# Patient Record
Sex: Male | Born: 1965 | Race: White | Hispanic: No | Marital: Single | Smoking: Current some day smoker
Health system: Southern US, Community
[De-identification: ages and names within clinical notes are randomized; demographics above are authoritative.]

## PROBLEM LIST (undated history)

## (undated) DIAGNOSIS — R188 Other ascites: Secondary | ICD-10-CM

## (undated) DIAGNOSIS — D509 Iron deficiency anemia, unspecified: Secondary | ICD-10-CM

## (undated) DIAGNOSIS — K449 Diaphragmatic hernia without obstruction or gangrene: Secondary | ICD-10-CM

## (undated) DIAGNOSIS — R161 Splenomegaly, not elsewhere classified: Secondary | ICD-10-CM

## (undated) DIAGNOSIS — K4090 Unilateral inguinal hernia, without obstruction or gangrene, not specified as recurrent: Secondary | ICD-10-CM

## (undated) DIAGNOSIS — M1990 Unspecified osteoarthritis, unspecified site: Secondary | ICD-10-CM

## (undated) DIAGNOSIS — C801 Malignant (primary) neoplasm, unspecified: Secondary | ICD-10-CM

## (undated) DIAGNOSIS — K7460 Unspecified cirrhosis of liver: Secondary | ICD-10-CM

## (undated) DIAGNOSIS — D696 Thrombocytopenia, unspecified: Secondary | ICD-10-CM

## (undated) DIAGNOSIS — E119 Type 2 diabetes mellitus without complications: Secondary | ICD-10-CM

## (undated) DIAGNOSIS — C649 Malignant neoplasm of unspecified kidney, except renal pelvis: Secondary | ICD-10-CM

## (undated) DIAGNOSIS — K766 Portal hypertension: Secondary | ICD-10-CM

## (undated) HISTORY — PX: FRACTURE SURGERY: SHX138

## (undated) HISTORY — DX: Diaphragmatic hernia without obstruction or gangrene: K44.9

---

## 2001-01-23 ENCOUNTER — Inpatient Hospital Stay (HOSPITAL_COMMUNITY): Admission: EM | Admit: 2001-01-23 | Discharge: 2001-01-28 | Payer: Self-pay | Admitting: Emergency Medicine

## 2001-01-23 ENCOUNTER — Encounter: Payer: Self-pay | Admitting: Emergency Medicine

## 2001-01-24 ENCOUNTER — Encounter: Payer: Self-pay | Admitting: Pulmonary Disease

## 2001-01-26 ENCOUNTER — Encounter: Payer: Self-pay | Admitting: Pulmonary Disease

## 2007-11-11 ENCOUNTER — Inpatient Hospital Stay (HOSPITAL_COMMUNITY): Admission: EM | Admit: 2007-11-11 | Discharge: 2007-11-16 | Payer: Self-pay | Admitting: Emergency Medicine
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
## 2010-10-03 LAB — CBC
HCT: 45.2
MCV: 91.5
Platelets: 310
RDW: 12.8
WBC: 16.8 — ABNORMAL HIGH

## 2010-10-03 LAB — PROTIME-INR: INR: 1

## 2010-10-03 LAB — TYPE AND SCREEN

## 2010-10-03 LAB — POCT I-STAT, CHEM 8
BUN: 13
Calcium, Ion: 1.15
Chloride: 108
Creatinine, Ser: 1.3
Glucose, Bld: 133 — ABNORMAL HIGH
HCT: 46
Potassium: 3.4 — ABNORMAL LOW

## 2010-10-03 LAB — DIFFERENTIAL
Monocytes Absolute: 1
Monocytes Relative: 6

## 2010-10-03 LAB — RAPID URINE DRUG SCREEN, HOSP PERFORMED
Amphetamines: NOT DETECTED
Barbiturates: NOT DETECTED
Benzodiazepines: NOT DETECTED

## 2010-10-03 LAB — ABO/RH: ABO/RH(D): A POS

## 2020-03-25 DIAGNOSIS — M059 Rheumatoid arthritis with rheumatoid factor, unspecified: Secondary | ICD-10-CM | POA: Insufficient documentation

## 2020-07-07 DIAGNOSIS — D509 Iron deficiency anemia, unspecified: Secondary | ICD-10-CM | POA: Insufficient documentation

## 2020-07-07 DIAGNOSIS — D696 Thrombocytopenia, unspecified: Secondary | ICD-10-CM | POA: Insufficient documentation

## 2020-07-07 DIAGNOSIS — K429 Umbilical hernia without obstruction or gangrene: Secondary | ICD-10-CM | POA: Insufficient documentation

## 2021-01-18 ENCOUNTER — Emergency Department: Payer: Commercial Managed Care - HMO

## 2021-01-18 ENCOUNTER — Encounter: Payer: Self-pay | Admitting: Medical Oncology

## 2021-01-18 ENCOUNTER — Emergency Department
Admission: EM | Admit: 2021-01-18 | Discharge: 2021-01-18 | Disposition: A | Discharge status: Home / Self Care | Payer: Commercial Managed Care - HMO | Attending: Emergency Medicine | Admitting: Emergency Medicine

## 2021-01-18 DIAGNOSIS — R718 Other abnormality of red blood cells: Secondary | ICD-10-CM | POA: Insufficient documentation

## 2021-01-18 DIAGNOSIS — K4090 Unilateral inguinal hernia, without obstruction or gangrene, not specified as recurrent: Secondary | ICD-10-CM | POA: Insufficient documentation

## 2021-01-18 DIAGNOSIS — K7460 Unspecified cirrhosis of liver: Secondary | ICD-10-CM

## 2021-01-18 DIAGNOSIS — K4091 Unilateral inguinal hernia, without obstruction or gangrene, recurrent: Secondary | ICD-10-CM

## 2021-01-18 DIAGNOSIS — N50811 Right testicular pain: Secondary | ICD-10-CM | POA: Diagnosis present

## 2021-01-18 DIAGNOSIS — I10 Essential (primary) hypertension: Secondary | ICD-10-CM | POA: Diagnosis not present

## 2021-01-18 DIAGNOSIS — K7469 Other cirrhosis of liver: Secondary | ICD-10-CM | POA: Insufficient documentation

## 2021-01-18 DIAGNOSIS — K8020 Calculus of gallbladder without cholecystitis without obstruction: Secondary | ICD-10-CM

## 2021-01-18 DIAGNOSIS — Z20822 Contact with and (suspected) exposure to covid-19: Secondary | ICD-10-CM | POA: Diagnosis not present

## 2021-01-18 HISTORY — DX: Unspecified osteoarthritis, unspecified site: M19.90

## 2021-01-18 HISTORY — DX: Type 2 diabetes mellitus without complications: E11.9

## 2021-01-18 LAB — RESP PANEL BY RT-PCR (FLU A&B, COVID) ARPGX2
Influenza A by PCR: NEGATIVE
Influenza B by PCR: NEGATIVE
SARS Coronavirus 2 by RT PCR: NEGATIVE

## 2021-01-18 LAB — CBC WITH DIFFERENTIAL/PLATELET
Abs Immature Granulocytes: 0.01 10*3/uL (ref 0.00–0.07)
Basophils Absolute: 0.1 10*3/uL (ref 0.0–0.1)
Basophils Relative: 1 %
Eosinophils Absolute: 0.2 10*3/uL (ref 0.0–0.5)
Eosinophils Relative: 5 %
HCT: 31.5 % — ABNORMAL LOW (ref 39.0–52.0)
Hemoglobin: 9 g/dL — ABNORMAL LOW (ref 13.0–17.0)
Immature Granulocytes: 0 %
Lymphocytes Relative: 20 %
Lymphs Abs: 1 10*3/uL (ref 0.7–4.0)
MCH: 20.1 pg — ABNORMAL LOW (ref 26.0–34.0)
MCHC: 28.6 g/dL — ABNORMAL LOW (ref 30.0–36.0)
MCV: 70.3 fL — ABNORMAL LOW (ref 80.0–100.0)
Monocytes Absolute: 0.6 10*3/uL (ref 0.1–1.0)
Monocytes Relative: 13 %
Neutro Abs: 2.9 10*3/uL (ref 1.7–7.7)
Neutrophils Relative %: 61 %
Platelets: 108 10*3/uL — ABNORMAL LOW (ref 150–400)
RBC: 4.48 MIL/uL (ref 4.22–5.81)
RDW: 16.8 % — ABNORMAL HIGH (ref 11.5–15.5)
WBC: 4.8 10*3/uL (ref 4.0–10.5)
nRBC: 0 % (ref 0.0–0.2)

## 2021-01-18 LAB — COMPREHENSIVE METABOLIC PANEL
ALT: 13 U/L (ref 0–44)
AST: 18 U/L (ref 15–41)
Albumin: 2.8 g/dL — ABNORMAL LOW (ref 3.5–5.0)
Alkaline Phosphatase: 92 U/L (ref 38–126)
Anion gap: 4 — ABNORMAL LOW (ref 5–15)
BUN: 14 mg/dL (ref 6–20)
CO2: 24 mmol/L (ref 22–32)
Calcium: 8 mg/dL — ABNORMAL LOW (ref 8.9–10.3)
Chloride: 104 mmol/L (ref 98–111)
Creatinine, Ser: 0.68 mg/dL (ref 0.61–1.24)
GFR, Estimated: >60 mL/min (ref >60)
Glucose, Bld: 370 mg/dL — ABNORMAL HIGH (ref 70–99)
Potassium: 3.9 mmol/L (ref 3.5–5.1)
Sodium: 132 mmol/L — ABNORMAL LOW (ref 135–145)
Total Bilirubin: 1 mg/dL (ref 0.3–1.2)
Total Protein: 7.3 g/dL (ref 6.5–8.1)

## 2021-01-18 LAB — LACTIC ACID, PLASMA: Lactic Acid, Venous: 1.9 mmol/L (ref 0.5–1.9)

## 2021-01-18 MED ORDER — OXYCODONE HCL 5 MG PO TABS: 5.0000 mg | ORAL_TABLET | Freq: Three times a day (TID) | ORAL | 0 refills | Status: DC | PRN

## 2021-01-18 NOTE — ED Triage Notes (Signed)
Pt reports for the past 2-3 days he has been having pain to rt groin from a hernia, pt states that the pain is radiating into rt side of his testicle. Pt denies NV. Pt denies dysuria.

## 2021-01-18 NOTE — ED Provider Notes (Signed)
[facility] Provider Note    Event Date/Time   First MD Initiated Contact with Patient 01/18/21 1151     (approximate)   History   Inguinal Hernia   HPI  Cole Pierce is a 56 y.o. male with a history of renal mass, chronic liver disease, right inguinal hernia presents to the emergency department for treatment and evaluation of right side scrotal/testicle pain.  He was diagnosed with an inguinal hernia approximately 2 months ago.  Pain has increased over the past 3 days.      Physical Exam   Triage Vital Signs: ED Triage Vitals [01/18/21 1137]  Enc Vitals Group     BP (!) 141/87     Pulse Rate 94     Resp 18     Temp 98.4 F (36.9 C)     Temp Source Oral     SpO2 100 %     Weight 182 lb (82.6 kg)     Height 5\' 8"  (1.727 m)     Head Circumference      Peak Flow      Pain Score 8     Pain Loc      Pain Edu?      Excl. in [place]?     Most recent vital signs: Vitals:   01/18/21 1317 01/18/21 1500  BP: 132/75 135/81  Pulse: 86 84  Resp: 16 16  Temp:    SpO2: 99% 100%    General: Awake, no distress.  CV:  Good peripheral perfusion.  Resp:  Normal effort.  Abd:  No distention.  Other:  Right side scrotum is soft, very tender to touch, normal in color   ED Results / Procedures / Treatments   Labs (all labs ordered are listed, but only abnormal results are displayed) Labs Reviewed  COMPREHENSIVE METABOLIC PANEL - Abnormal; Notable for the following components:      Result Value   Sodium 132 (*)    Glucose, Bld 370 (*)    Calcium 8.0 (*)    Albumin 2.8 (*)    Anion gap 4 (*)    All other components within normal limits  CBC WITH DIFFERENTIAL/PLATELET - Abnormal; Notable for the following components:   Hemoglobin 9.0 (*)    HCT 31.5 (*)    MCV 70.3 (*)    MCH 20.1 (*)    MCHC 28.6 (*)    RDW 16.8 (*)    Platelets 108 (*)    All other components within normal limits  RESP PANEL BY RT-PCR (FLU A&B, COVID) ARPGX2  LACTIC ACID,  PLASMA     EKG  Not indicated   RADIOLOGY  CT of the abdomen and pelvis shows hepatic cirrhosis, splenomegaly, cholelithiasis, complex mass in the lower pole of the right kidney, large volume ascites, and right inguinal hernia which also contains ascites.  PROCEDURES:  Critical Care performed: No  Procedures   MEDICATIONS ORDERED IN ED: Medications - No data to display   IMPRESSION / MDM / [place] / ED COURSE  I reviewed the triage vital signs and the nursing notes.                              Differential diagnosis includes, but is not limited to, incarcerated hernia, uncomplicated inguinal hernia.  56 year old male presents to the emergency department for treatment and evaluation of right-sided scrotal pain.  Symptoms have been present and ongoing for  the past couple of months, however pain has increased over the last 2 to 3 days.  He denies fevers, nausea, vomiting.  Plan will be to get imaging to rule out incarcerated hernia.  Upon review of the chart, he was evaluated in September at [facility] and diagnosed with chronic liver disease, renal mass, and a right inguinal hernia.  Patient tells me that he was referred to a specialist but has not made an appointment.   Review of labs from ER visit from [facility] show hypocalcemia at 8.4 hemoglobin 9.4 with 30.4 hematocrit, platelet count of 103.  Platelet morphology showing hypersegmented neutrophils, toxic granulocytes, and moderate poikilocytosis.  Today's lab values are similar with a calcium of 8.0, hemoglobin 9.0, hematocrit 31.5.  CT abdomen pelvis today shows hepatic cirrhosis, cholelithiasis, splenomegaly and small upper abdominal varices consistent with portal hypertension, cholelithiasis, complex mass in the lower pole of the right kidney, ascites, and a right inguinal hernia without incarceration.  These findings are similar to CT of the abdomen pelvis performed in September.  Patient states that he is not  having any abdominal pain with the exception of the pressure in the right suprapubic area, no nausea, or vomiting.   Results of labs and CT discussed with the patient and his wife.  Admission was offered for further work-up of abnormal CT findings and pain management of hernia..  Patient declined admission and requests to be referred to a specialist here in [place].  He was advised that it is extremely important that he make these follow-up appointments. He was advised against taking Tylenol or drinking any alcohol. For hernia pain control, he was advised to use an athletic support. He will be given a prescription for a few Roxicet as well. Referrals for GI and general surgery provided.  Very strict ER return precautions given.       FINAL CLINICAL IMPRESSION(S) / ED DIAGNOSES   Final diagnoses:  Chronic liver disease and cirrhosis ([place])  Calculus of gallbladder without cholecystitis without obstruction  Unilateral recurrent inguinal hernia without obstruction or gangrene     Rx / DC Orders   ED Discharge Orders          Ordered    oxyCODONE (ROXICODONE) 5 MG immediate release tablet  Every 8 hours PRN        01/18/21 1511             Note:  This document was prepared using Dragon voice recognition software and may include unintentional dictation errors.   Victorino Dike, FNP 01/18/21 1750    Naaman Plummer, MD 01/19/21 [phone]

## 2021-01-18 NOTE — Discharge Instructions (Addendum)
It is very important to call and schedule an appointment with both the GI specialist and the surgeon.  Do not take any medication that contains tylenol and do not drink alcohol.  Wear an athletic support to help with scrotal pain.  If the pain becomes worse, return to the ER.

## 2021-01-19 ENCOUNTER — Other Ambulatory Visit: Payer: Self-pay

## 2021-01-19 ENCOUNTER — Encounter: Payer: Self-pay | Admitting: Surgery

## 2021-01-19 ENCOUNTER — Ambulatory Visit (INDEPENDENT_AMBULATORY_CARE_PROVIDER_SITE_OTHER): Payer: Managed Care, Other (non HMO) | Admitting: Surgery

## 2021-01-19 VITALS — BP 128/79 | HR 93 | Temp 98.2°F | Ht 68.0 in | Wt 180.6 lb

## 2021-01-19 DIAGNOSIS — K7460 Unspecified cirrhosis of liver: Secondary | ICD-10-CM | POA: Diagnosis not present

## 2021-01-19 DIAGNOSIS — N179 Acute kidney failure, unspecified: Secondary | ICD-10-CM

## 2021-01-19 DIAGNOSIS — R188 Other ascites: Secondary | ICD-10-CM | POA: Insufficient documentation

## 2021-01-19 DIAGNOSIS — K4090 Unilateral inguinal hernia, without obstruction or gangrene, not specified as recurrent: Secondary | ICD-10-CM | POA: Diagnosis not present

## 2021-01-19 DIAGNOSIS — E119 Type 2 diabetes mellitus without complications: Secondary | ICD-10-CM

## 2021-01-19 DIAGNOSIS — N2889 Other specified disorders of kidney and ureter: Secondary | ICD-10-CM

## 2021-01-19 NOTE — Progress Notes (Signed)
Patient ID: Cole Pierce, male   DOB: November 21, 1965, 56 y.o.   MRN: [id number]  Chief Complaint: Right inguinal hernia pain  History of Present Illness Cole Pierce is a 56 y.o. male with known hepatic cirrhosis with ascites, previously diagnosed right inguinal hernia in September, also identified right lower pole renal lesion of 5.6 cm September CT scan at [facility].  Referred by ED from visit yesterday.  He reports his right groin pain resolves with recumbent positioning.  He has not utilize scrotal support as of yet.  He denies any history of alcohol abuse, IV drug abuse or prior history of hepatitis.  Was unaware he had liver disease despite CT scan last September.  He reports having been incarcerated at that time, and having very little to say in his medical referrals.  He has not pursued the consultations recommended secondary to not having the finances.  Concerned that any of the medications he has been getting for his arthritis might be to blame for his liver disease. He denies any history of right upper quadrant pain.  He denies any history of nausea, vomiting, fevers or chills.  Reports no urinary issues, and normal bowel movements.  Past Medical History Past Medical History:  Diagnosis Date   Arthritis    Diabetes mellitus without complication ([place])       Past Surgical History:  Procedure Laterality Date   FRACTURE SURGERY      Allergies  Allergen Reactions   Zantac [Ranitidine Hcl] Anaphylaxis   Penicillins Hives    Hives in mouth    Current Outpatient Medications  Medication Sig Dispense Refill   acetaminophen (TYLENOL) 325 MG tablet Take 2 tablets by mouth 3 (three) times daily as needed.     Methotrexate Sodium (METHOTREXATE, PF,) 50 MG/2ML injection as directed.     oxyCODONE (ROXICODONE) 5 MG immediate release tablet Take 1 tablet (5 mg total) by mouth every 8 (eight) hours as needed. 12 tablet 0   No current facility-administered medications for this visit.    Family  History History reviewed. No pertinent family history.    Social History Social History   Tobacco Use   Smoking status: Some Days    Types: Cigars   Smokeless tobacco: Never  Substance Use Topics   Alcohol use: Not Currently   Drug use: Never        Review of Systems  Constitutional: Negative.   HENT: Negative.    Eyes: Negative.   Respiratory: Negative.    Cardiovascular:  Negative for chest pain and orthopnea.  Gastrointestinal: Negative.   Genitourinary: Negative.   Musculoskeletal:  Positive for joint pain.  Skin: Negative.   Neurological: Negative.   Psychiatric/Behavioral: Negative.       Physical Exam Blood pressure 128/79, pulse 93, temperature 98.2 F (36.8 C), temperature source Oral, height 5\' 8"  (1.727 m), weight 180 lb 9.6 oz (81.9 kg), SpO2 98 %. Last Weight  Most recent update: 01/19/2021  9:08 AM    Weight  81.9 kg (180 lb 9.6 oz)             CONSTITUTIONAL: Well developed, and nourished, appropriately responsive and aware without distress.   EYES: Sclera non-icteric.   EARS, NOSE, MOUTH AND THROAT: Mask worn.    Hearing is intact to voice.  NECK: Trachea is midline, and there is no jugular venous distension.  LYMPH NODES:  Lymph nodes in the neck are not enlarged. RESPIRATORY:  Lungs are clear, and breath sounds are equal bilaterally. Normal respiratory  effort without pathologic use of accessory muscles. CARDIOVASCULAR: Heart is regular in rate and rhythm. GI: He has a flat bluish discolored umbilical skin, with associated underlying fascial defect that soft and nontender.  The abdomen is soft, nontender, and nondistended. There were no palpable masses. I did not appreciate hepatosplenomegaly. There were normal bowel sounds. GU: Readily appreciable right inguinal hernia, readily reducible.  She MUSCULOSKELETAL:  Symmetrical muscle tone appreciated in all four extremities.    SKIN: Skin turgor is normal. No pathologic skin lesions appreciated.   NEUROLOGIC:  Motor and sensation appear grossly normal.  Cranial nerves are grossly without defect. PSYCH:  Alert and oriented to person, place and time. Affect is appropriate for situation.  Data Reviewed I have personally reviewed what is currently available of the patient's imaging, recent labs and medical records.   Labs:  CBC Latest Ref Rng & Units 01/18/2021 11/11/2007 11/11/2007  WBC 4.0 - 10.5 K/uL 4.8 - 16.8(H)  Hemoglobin 13.0 - 17.0 g/dL 9.0(L) 15.6 15.1  Hematocrit 39.0 - 52.0 % 31.5(L) 46.0 45.2  Platelets 150 - 400 K/uL 108(L) - 310   CMP Latest Ref Rng & Units 01/18/2021 11/11/2007  Glucose 70 - 99 mg/dL 370(H) 133(H)  BUN 6 - 20 mg/dL 14 13  Creatinine 0.61 - 1.24 mg/dL 0.68 1.3  Sodium 135 - 145 mmol/L 132(L) 142  Potassium 3.5 - 5.1 mmol/L 3.9 3.4(L)  Chloride 98 - 111 mmol/L 104 108  CO2 22 - 32 mmol/L 24 -  Calcium 8.9 - 10.3 mg/dL 8.0(L) -  Total Protein 6.5 - 8.1 g/dL 7.3 -  Total Bilirubin 0.3 - 1.2 mg/dL 1.0 -  Alkaline Phos 38 - 126 U/L 92 -  AST 15 - 41 U/L 18 -  ALT 0 - 44 U/L 13 -      Imaging:  Within last 24 hrs: CT ABDOMEN WO CONTRAST  Result Date: 01/18/2021 CLINICAL DATA:  Right inguinal pain.  Abdominal pain.  Hernia. EXAM: CT ABDOMEN AND PELVIS WITHOUT CONTRAST TECHNIQUE: Multidetector CT imaging of the abdomen and pelvis was performed following the standard protocol without IV contrast. RADIATION DOSE REDUCTION: This exam was performed according to the departmental dose-optimization program which includes automated exposure control, adjustment of the mA and/or kV according to patient size and/or use of iterative reconstruction technique. COMPARISON:  None. FINDINGS: Lower chest: No acute abnormality. Hepatobiliary: Liver is diffusely nodular with inhomogeneous parenchyma consistent with cirrhosis. No definite focal mass identified in the liver. Gallbladder is nearly completely filled with calcified stones. There is mild gallbladder wall thickening  most likely chronic. No biliary ductal dilatation appreciated. Pancreas: Unremarkable. No pancreatic ductal dilatation or surrounding inflammatory changes. Spleen: Markedly enlarged measuring up to 17.4 cm in length. Adrenals/Urinary Tract: Adrenal glands appear normal. There is an approximately 5 x 3.5 cm bilobed appearing or adjacent lesions in the anterior lower pole the right kidney with the appearance of septations and partial calcifications, not well visualized without contrast. Otherwise no nephrolithiasis or hydronephrosis visualized bilaterally. Urinary bladder appears within normal limits. Stomach/Bowel: Mild wall thickening of the visualized distal esophagus. No bowel obstruction, free air or pneumatosis. Moderate amount of retained fecal material throughout the colon. No bowel wall edema identified. No evidence of acute appendicitis. Vascular/Lymphatic: Small varicosities throughout the upper abdomen. No bulky lymphadenopathy identified in the abdomen or pelvis. Reproductive: Prostate gland unremarkable. Other: Large volume ascites. Right inguinal hernia which contains ascitic fluid. Musculoskeletal: No acute or significant osseous findings. IMPRESSION: 1. Hepatic cirrhosis. 2. Marked splenomegaly  and small upper abdominal varices, consistent with portal hypertension. 3. Cholelithiasis. 4. Complex mass in the lower pole the right kidney as described, not well evaluated without contrast. Recommend follow-up nonemergent evaluation with ultrasound and/or renal mass protocol CT or MRI. 5. Large volume ascites. 6. Right inguinal hernia which contains ascites. 7. Mild wall thickening throughout the visualized distal esophagus, correlate clinically and follow-up as indicated. Electronically Signed   By: Ofilia Neas M.D.   On: 01/18/2021 14:27   CT PELVIS WO CONTRAST  Result Date: 01/18/2021 CLINICAL DATA:  Right inguinal pain.  Abdominal pain.  Hernia. EXAM: CT ABDOMEN AND PELVIS WITHOUT CONTRAST  TECHNIQUE: Multidetector CT imaging of the abdomen and pelvis was performed following the standard protocol without IV contrast. RADIATION DOSE REDUCTION: This exam was performed according to the departmental dose-optimization program which includes automated exposure control, adjustment of the mA and/or kV according to patient size and/or use of iterative reconstruction technique. COMPARISON:  None. FINDINGS: Lower chest: No acute abnormality. Hepatobiliary: Liver is diffusely nodular with inhomogeneous parenchyma consistent with cirrhosis. No definite focal mass identified in the liver. Gallbladder is nearly completely filled with calcified stones. There is mild gallbladder wall thickening most likely chronic. No biliary ductal dilatation appreciated. Pancreas: Unremarkable. No pancreatic ductal dilatation or surrounding inflammatory changes. Spleen: Markedly enlarged measuring up to 17.4 cm in length. Adrenals/Urinary Tract: Adrenal glands appear normal. There is an approximately 5 x 3.5 cm bilobed appearing or adjacent lesions in the anterior lower pole the right kidney with the appearance of septations and partial calcifications, not well visualized without contrast. Otherwise no nephrolithiasis or hydronephrosis visualized bilaterally. Urinary bladder appears within normal limits. Stomach/Bowel: Mild wall thickening of the visualized distal esophagus. No bowel obstruction, free air or pneumatosis. Moderate amount of retained fecal material throughout the colon. No bowel wall edema identified. No evidence of acute appendicitis. Vascular/Lymphatic: Small varicosities throughout the upper abdomen. No bulky lymphadenopathy identified in the abdomen or pelvis. Reproductive: Prostate gland unremarkable. Other: Large volume ascites. Right inguinal hernia which contains ascitic fluid. Musculoskeletal: No acute or significant osseous findings. IMPRESSION: 1. Hepatic cirrhosis. 2. Marked splenomegaly and small upper  abdominal varices, consistent with portal hypertension. 3. Cholelithiasis. 4. Complex mass in the lower pole the right kidney as described, not well evaluated without contrast. Recommend follow-up nonemergent evaluation with ultrasound and/or renal mass protocol CT or MRI. 5. Large volume ascites. 6. Right inguinal hernia which contains ascites. 7. Mild wall thickening throughout the visualized distal esophagus, correlate clinically and follow-up as indicated. Electronically Signed   By: Ofilia Neas M.D.   On: 01/18/2021 14:27    Assessment    Right inguinal hernia.  Patient Active Problem List   Diagnosis Date Noted   Type 2 diabetes mellitus without complication, without long-term current use of insulin ([place]) 01/19/2021   Cirrhosis of liver with ascites ([place]) 01/19/2021   Abdominal fluid collection 01/19/2021    Plan    This gentleman needs a renal CT scanning to evaluate this lesion further, to follow-up on the GI referral to evaluate the etiology of his cirrhosis, and to manage his ascites. He also needs to get plugged in with a PCP for management of his diabetes. At this point in time his right inguinal hernia repair is elective it is readily reducible, and exacerbated primarily by his ascitic fluid. I will be glad to see him back once these other priorities have been addressed.  Face-to-face time spent with the patient and accompanying care providers(if present)  was 30 minutes, with more than 50% of the time spent counseling, educating, and coordinating care of the patient.    These notes generated with voice recognition software. I apologize for typographical errors.  Ronny Bacon M.D., FACS 01/19/2021, 9:47 AM

## 2021-01-19 NOTE — Patient Instructions (Addendum)
Your CT is scheduled for Friday 02/03/2021 @ 1:30 pm (arrive by 1:15 pm) at the [facility] on [address]. Nothing to eat or drink 4 hours prior.   We have put in a referral to Urology. They will call you with an appointment.   If you have any concerns or questions, please feel free to call our office.   Cholelithiasis Cholelithiasis happens when gallstones form in the gallbladder. The gallbladder stores bile. Bile is a fluid that helps digest fats. Bile can harden and form into gallstones. If they cause a blockage, they can cause pain (gallbladder attack). What are the causes? This condition may be caused by: Some blood diseases, such as sickle cell anemia. Too much of a fat-like substance (cholesterol) in your bile. Not enough bile salts in your bile. These salts help the body absorb and digest fats. The gallbladder not emptying fully or often enough. This is common in pregnant women. What increases the risk? The following factors may make you more likely to develop this condition: Being male. Being pregnant many times. Eating a lot of fried foods, fat, and refined carbs (refined carbohydrates). Being very overweight (obese). Being older than age 44. Using medicines with male hormones in them for a long time. Losing weight fast. Having gallstones in your family. Having some health problems, such as diabetes, Crohn's disease, or liver disease. What are the signs or symptoms? Often, there may be gallstones but no symptoms. These gallstones are called silent gallstones. If a gallstone causes a blockage, you may get sudden pain. The pain: Can be in the upper right part of your belly (abdomen). Normally comes at night or after you eat. Can last an hour or more. Can spread to your right shoulder, back, or chest. Can feel like discomfort, burning, or fullness in the upper part of your belly (indigestion). If the blockage lasts more than a few hours, you can get an  infection or swelling. You may: Feel like you may vomit. Vomit. Feel bloated. Have belly pain for 5 hours or more. Feel tender in your belly, often in the upper right part and under your ribs. Have fever or chills. Have skin or the white parts of your eyes turn yellow (jaundice). Have dark pee (urine) or pale poop (stool). How is this treated? Treatment for this condition depends on how bad you feel. If you have symptoms, you may need: Home care, if symptoms are not very bad. Do not eat for 12-24 hours. Drink only water and clear liquids. Start to eat simple or clear foods after 1 or 2 days. Try broths and crackers. You may need medicines for pain or stomach upset or both. If you have an infection, you will need antibiotics. A hospital stay, if you have very bad pain or a very bad infection. Surgery to remove your gallbladder. You may need this if: Gallstones keep coming back. You have very bad symptoms. Medicines to break up gallstones. Medicines: Are best for small gallstones. May be used for up to 6-12 months. A procedure to find and take out gallstones or to break up gallstones. Follow these instructions at home: Medicines Take over-the-counter and prescription medicines only as told by your doctor. If you were prescribed an antibiotic medicine, take it as told by your doctor. Do not stop taking the antibiotic even if you start to feel better. Ask your doctor if the medicine prescribed to you requires you to avoid driving or using machinery. Eating and drinking Drink enough fluid  to keep your urine pale yellow. Drink water or clear fluids. This is important when you have pain. Eat healthy foods. Choose: Fewer fatty foods, such as fried foods. Fewer refined carbs. Avoid breads and grains that are highly processed, such as white bread and white rice. Choose whole grains, such as whole-wheat bread and brown rice. More fiber. Almonds, fresh fruit, and beans are healthy  sources. General instructions Keep a healthy weight. Keep all follow-up visits as told by your doctor. This is important. Where to find more information [employer] of Diabetes and Digestive and Kidney Diseases: [email or web address] Contact a doctor if: You have sudden pain in the upper right part of your belly. Pain might spread to your right shoulder, back, or chest. You have been diagnosed with gallstones that have no symptoms and you get: Belly pain. Discomfort, burning, or fullness in the upper part of your abdomen. You have dark urine or pale stools. Get help right away if: You have sudden pain in the upper right part of your abdomen, and the pain lasts more than 2 hours. You have pain in your abdomen, and: It lasts more than 5 hours. It keeps getting worse. You have a fever or chills. You keep feeling like you may vomit. You keep vomiting. Your skin or the white parts of your eyes turn yellow. Summary Cholelithiasis happens when gallstones form in the gallbladder. This condition may be caused by a blood disease, too much of a fat-like substance in the bile, or not enough bile salts in bile. Treatment for this condition depends on how bad you feel. If you have symptoms, do not eat or drink. You may need medicines. You may need a hospital stay for very bad pain or a very bad infection. You may need surgery if gallstones keep coming back or if you have very bad symptoms. This information is not intended to replace advice given to you by your health care provider. Make sure you discuss any questions you have with your health care provider.  Umbilical Hernia, Adult A hernia is a bulge of tissue that pushes through an opening between muscles. An umbilical hernia happens in the abdomen, near the belly button (umbilicus). The hernia may contain tissues from the small intestine, large intestine, or fatty tissue covering the intestines. Umbilical hernias in adults tend to get worse  over time, and they require surgical treatment. There are different types of umbilical hernias, including: Indirect hernia. This type is located just above or below the umbilicus. It is the most common type of umbilical hernia in adults. Direct hernia. This type forms through an opening formed by the umbilicus. Reducible hernia. This type of hernia comes and goes. It may be visible only when you strain, lift something heavy, or cough. This type of hernia can be pushed back into the abdomen (reduced). Incarcerated hernia. This type traps abdominal tissue inside the hernia. This type of hernia cannot be reduced. Strangulated hernia. This type of hernia cuts off blood flow to the tissues inside the hernia. The tissues can start to die if this happens. This type of hernia requires emergency treatment. What are the causes? An umbilical hernia happens when tissue inside the abdomen presses on a weak area of the abdominal muscles. What increases the risk? You may have a greater risk of this condition if you: Are obese. Have had several pregnancies. Have a buildup of fluid inside your abdomen. Have had surgery that weakens the abdominal muscles. What are the signs or symptoms?  The main symptom of this condition is a painless bulge at or near the belly button. A reducible hernia may be visible only when you strain, lift something heavy, or cough. Other symptoms may include: Dull pain. A feeling of pressure. Symptoms of a strangulated hernia may include: Pain that gets increasingly worse. Nausea and vomiting. Pain when pressing on the hernia. Skin over the hernia becoming red or purple. Constipation. Blood in the stool. How is this diagnosed? This condition may be diagnosed based on: A physical exam. You may be asked to cough or strain while standing. These actions increase the pressure inside your abdomen and can force the hernia through the opening in your muscles. Your health care provider may  try to reduce the hernia by pressing on it. Your symptoms and medical history. How is this treated? Surgery is the only treatment for an umbilical hernia. Surgery for a strangulated hernia is done as soon as possible. If you have a small hernia that is not incarcerated, you may need to lose weight before having surgery. Follow these instructions at home: Lose weight, if told by your health care provider. Do not try to push the hernia back in. Watch your hernia for any changes in color or size. Tell your health care provider if any changes occur. You may need to avoid activities that increase pressure on your hernia. Do not lift anything that is heavier than 10 lb (4.5 kg), or the limit that you are told, until your health care provider says that it is safe. Take over-the-counter and prescription medicines only as told by your health care provider. Keep all follow-up visits. This is important. Contact a health care provider if: Your hernia gets larger. Your hernia becomes painful. Get help right away if: You develop sudden, severe pain near the area of your hernia. You have pain as well as nausea or vomiting. You have pain and the skin over your hernia changes color. You develop a fever or chills. Summary A hernia is a bulge of tissue that pushes through an opening between muscles. An umbilical hernia happens near the belly button. Surgery is the only treatment for an umbilical hernia. Do not try to push your hernia back in. Keep all follow-up visits. This is important. This information is not intended to replace advice given to you by your health care provider. Make sure you discuss any questions you have with your health care provider.  Inguinal Hernia, Adult An inguinal hernia is when fat or your intestines push through a weak spot in a muscle where your leg meets your lower belly (groin). This causes a bulge. This kind of hernia could also be: In your scrotum, if you are male. In  folds of skin around your vagina, if you are male. There are three types of inguinal hernias: Hernias that can be pushed back into the belly (are reducible). This type rarely causes pain. Hernias that cannot be pushed back into the belly (are incarcerated). Hernias that cannot be pushed back into the belly and lose their blood supply (are strangulated). This type needs emergency surgery. What are the causes? This condition is caused by having a weak spot in the muscles or tissues in your groin. This develops over time. The hernia may poke through the weak spot when you strain your lower belly muscles all of a sudden, such as when you: Lift a heavy object. Strain to poop (have a bowel movement). Trouble pooping (constipation) can lead to straining. Cough. What increases  the risk? This condition is more likely to develop in: Males. Pregnant females. People who: Are overweight. Work in jobs that require long periods of standing or heavy lifting. Have had an inguinal hernia before. Smoke or have lung disease. These factors can lead to long-term (chronic) coughing. What are the signs or symptoms? Symptoms may depend on the size of the hernia. Often, a small hernia has no symptoms. Symptoms of a larger hernia may include: A bulge in the groin area. This is easier to see when standing. You might not be able to see it when you are lying down. Pain or burning in the groin. This may get worse when you lift, strain, or cough. A dull ache or a feeling of pressure in the groin. An abnormal bulge in the scrotum, in males. Symptoms of a strangulated inguinal hernia may include: A bulge in your groin that is very painful and tender to the touch. A bulge that turns red or purple. Fever, feeling like you may vomit (nausea), and vomiting. Not being able to poop or to pass gas. How is this treated? Treatment depends on the size of your hernia and whether you have symptoms. If you do not have symptoms,  your doctor may have you watch your hernia carefully and have you come in for follow-up visits. If your hernia is large or if you have symptoms, you may need surgery to repair the hernia. Follow these instructions at home: Lifestyle Avoid lifting heavy objects. Avoid standing for long amounts of time. Do not smoke or use any products that contain nicotine or tobacco. If you need help quitting, ask your doctor. Stay at a healthy weight. Prevent trouble pooping You may need to take these actions to prevent or treat trouble pooping: Drink enough fluid to keep your pee (urine) pale yellow. Take over-the-counter or prescription medicines. Eat foods that are high in fiber. These include beans, whole grains, and fresh fruits and vegetables. Limit foods that are high in fat and sugar. These include fried or sweet foods. General instructions You may try to push your hernia back in place by very gently pressing on it when you are lying down. Do not try to push the bulge back in if it will not go in easily. Watch your hernia for any changes in shape, size, or color. Tell your doctor if you see any changes. Take over-the-counter and prescription medicines only as told by your doctor. Keep all follow-up visits. Contact a doctor if: You have a fever or chills. You have new symptoms. Your symptoms get worse. Get help right away if: You have pain in your groin that gets worse all of a sudden. You have a bulge in your groin that: Gets bigger all of a sudden, and it does not get smaller after that. Turns red or purple. Is painful when you touch it. You are a male, and you have: Sudden pain in your scrotum. A sudden change in the size of your scrotum. You cannot push the hernia back in place by very gently pressing on it when you are lying down. You feel like you may vomit, and that feeling does not go away. You keep vomiting. You have a fast heartbeat. You cannot poop or pass gas. These symptoms  may be an emergency. Get help right away. Call your local emergency services (911 in the [place] Do not wait to see if the symptoms will go away. Do not drive yourself to the hospital. Summary An inguinal hernia is  when fat or your intestines push through a weak spot in a muscle where your leg meets your lower belly (groin). This causes a bulge. If you do not have symptoms, you may not need treatment. If you have symptoms or a large hernia, you may need surgery. Avoid lifting heavy objects. Also, avoid standing for long amounts of time. Do not try to push the bulge back in if it will not go in easily. This information is not intended to replace advice given to you by your health care provider. Make sure you discuss any questions you have with your health care provider. Document Revised: 08/18/2019 Document Reviewed: 08/18/2019 Elsevier Patient Education  2022 [employer].

## 2021-02-03 ENCOUNTER — Ambulatory Visit: Payer: Managed Care, Other (non HMO) | Attending: Surgery

## 2021-02-09 ENCOUNTER — Ambulatory Visit: Payer: Self-pay | Admitting: Urology

## 2021-02-21 ENCOUNTER — Ambulatory Visit
Admission: RE | Admit: 2021-02-21 | Discharge: 2021-02-21 | Disposition: A | Discharge status: Home / Self Care | Payer: Commercial Managed Care - HMO | Source: Ambulatory Visit | Attending: Surgery | Admitting: Surgery

## 2021-02-21 ENCOUNTER — Telehealth: Payer: Self-pay | Admitting: Surgery

## 2021-02-21 ENCOUNTER — Other Ambulatory Visit: Payer: Self-pay

## 2021-02-21 DIAGNOSIS — N179 Acute kidney failure, unspecified: Secondary | ICD-10-CM | POA: Insufficient documentation

## 2021-02-21 LAB — POCT I-STAT CREATININE: Creatinine, Ser: 0.6 mg/dL — ABNORMAL LOW (ref 0.61–1.24)

## 2021-02-21 MED ORDER — IOHEXOL 300 MG/ML  SOLN
100.0000 mL | Freq: Once | INTRAMUSCULAR | Status: AC | PRN
  Administered 2021-02-21: 100 mL via INTRAVENOUS

## 2021-02-21 NOTE — Telephone Encounter (Signed)
Spoke with patient-instructed to call [facility] and follow up and to keep his appointment with Dr.Sninsky on 02/23/21 and to call our office and schedule an appointment after he sees the above providers. Number provided.

## 2021-02-21 NOTE — Telephone Encounter (Signed)
Patient is calling and is asking if he needs to follow up with Dr. Christian Mate. Please advise.

## 2021-02-23 ENCOUNTER — Other Ambulatory Visit: Payer: Self-pay

## 2021-02-23 ENCOUNTER — Ambulatory Visit (INDEPENDENT_AMBULATORY_CARE_PROVIDER_SITE_OTHER): Payer: Managed Care, Other (non HMO) | Admitting: Urology

## 2021-02-23 ENCOUNTER — Encounter: Payer: Self-pay | Admitting: Urology

## 2021-02-23 VITALS — BP 129/66 | HR 91 | Ht 68.0 in | Wt 172.0 lb

## 2021-02-23 DIAGNOSIS — N289 Disorder of kidney and ureter, unspecified: Secondary | ICD-10-CM | POA: Diagnosis not present

## 2021-02-23 DIAGNOSIS — K7460 Unspecified cirrhosis of liver: Secondary | ICD-10-CM | POA: Diagnosis not present

## 2021-02-23 DIAGNOSIS — N2889 Other specified disorders of kidney and ureter: Secondary | ICD-10-CM

## 2021-02-23 DIAGNOSIS — N179 Acute kidney failure, unspecified: Secondary | ICD-10-CM

## 2021-02-23 DIAGNOSIS — R188 Other ascites: Secondary | ICD-10-CM

## 2021-02-23 LAB — URINALYSIS, COMPLETE
Bilirubin, UA: NEGATIVE
Leukocytes,UA: NEGATIVE
Nitrite, UA: NEGATIVE
Protein,UA: NEGATIVE
RBC, UA: NEGATIVE
Specific Gravity, UA: 1.025 (ref 1.005–1.030)
Urobilinogen, Ur: 1 mg/dL (ref 0.2–1.0)
pH, UA: 6 (ref 5.0–7.5)

## 2021-02-23 LAB — MICROSCOPIC EXAMINATION
Bacteria, UA: NONE SEEN
RBC: NONE SEEN /hpf (ref 0–2)

## 2021-02-23 MED ORDER — CELECOXIB 50 MG PO CAPS: 50.0000 mg | ORAL_CAPSULE | Freq: Every day | ORAL | 0 refills | Status: DC | PRN

## 2021-02-23 NOTE — Patient Instructions (Signed)
Renal Mass  A renal mass is an abnormal growth in the kidney. It may be found while performing an MRI, CT scan, or ultrasound to evaluate other problems of the abdomen. A renal mass that is cancerous (malignant) may grow or spread quickly. Others are not cancerous (benign). Renal masses include: Tumors. These may be malignant or benign. The most common type of kidney cancer in adults is renal cell carcinoma. In children, the most common type of kidney cancer is Wilms tumor. The most common benign tumors of the kidney include renal adenomas, oncocytomas, and angiomyolipoma (AML). Cysts. These are fluid-filled sacs that form on or in the kidney. What are the causes? Certain types of cancers, infections, or injuries can cause a renal mass. It isnot always known what causes a cyst to develop in or on the kidney. What are the signs or symptoms? Often, a renal mass does not cause any signs or symptoms; most kidney cysts donot cause symptoms. How is this diagnosed? Your health care provider may recommend tests to diagnose the cause of your renal mass. These tests may be done if a renal mass is found: Physical exam. Blood tests. Urine tests. Imaging tests, such as ultrasound, CT scan, or MRI. Biopsy. This is a small sample that is removed from the renal mass and tested in a lab. The exact tests and how often they are done will depend on: The size and appearance of the renal mass. Risk factors or medical conditions that increase your risk for problems. Any symptoms associated with the renal mass, or concerns that you have about it. Tests and physical exams may be done once, or they may be done regularly for a period of time. Tests and exams that are done regularly will help monitorwhether the mass is growing and beginning to cause problems. How is this treated? Treatment is not always needed for this condition. Your health care provider may recommend careful monitoring and regular tests and exams.  Treatment willdepend on the cause of the mass. Treatment for a cancerous renal mass may include surgical removal,chemotherapy, radiation, or immunotherapy. Most kidney cysts do not need to be treated. Follow these instructions at home: What you need to do at home will depend on the cause of the mass. Follow the instructions that your health care provider gives to you. In general: Take over-the-counter and prescription medicines only as told by your health care provider. If you were prescribed an antibiotic medicine, take it as told by your health care provider. Do not stop taking the antibiotic even if you start to feel better. Follow any restrictions that are given to you by your health care provider. Keep all follow-up visits. This is important. You may need to see your health care provider once or twice a year to have CT scans and ultrasounds. These tests will show if your renal mass has changed or grown. Contact a health care provider if you: Have pain in your side or back (flank pain). Have a fever. Feel full soon after eating. Have pain or swelling in the abdomen. Lose weight. Get help right away if: Your pain gets worse. There is blood in your urine. You cannot urinate. You have chest pain. You have trouble breathing. These symptoms may represent a serious problem that is an emergency. Do not wait to see if the symptoms will go away. Get medical help right away. Call your local emergency services (911 in the [place] Summary A renal mass is an abnormal growth in the kidney.   It may be cancerous (malignant) and grow or spread quickly, or it may not be cancerous (benign). Renal masses often do not have any signs or symptoms. Renal masses may be found while performing an MRI, CT scan, or ultrasound for other problems of the abdomen. Your health care provider may recommend that you have tests to diagnose the cause of your renal mass. These may include a physical exam, blood tests, urine  tests, imaging, or a biopsy. Treatment is not always needed for this condition. Careful monitoring may be recommended. This information is not intended to replace advice given to you by your health care provider. Make sure you discuss any questions you have with your healthcare provider. Document Revised: 06/15/2019 Document Reviewed: 06/15/2019 Elsevier Patient Education  2022 [employer] Inc[employer]  

## 2021-02-23 NOTE — Progress Notes (Signed)
02/23/21 3:18 PM   Cole Pierce 08/24/1965 [id number]  CC: Right renal mass, right scrotal pain  HPI: 56 year old male who previously has been incarcerated for the last 13 years and not received routine medical care and presents with the above issues.  He was originally seen in the ER on 01/18/2021 and CT abdomen and pelvis showed hepatic cirrhosis with significant splenomegaly, upper abdominal varices consistent with portal hypertension, complex mass in the lower pole of the right kidney measuring 5.5 cm that was similar to prior CT from September 2022 at [facility], as well as significant ascites and a right inguinal hernia which contained ascites.  He was seen by Dr. Christian Mate with general surgery who recommended GI referral to evaluate for the etiology of his cirrhosis and manage his ascites, as well as PCP referral to establish care for his diabetes, and referral to urology for the right renal mass.Right inguinal hernia repair was easily reducible and felt to be elective, and he recommended addressing the above issues prior to any surgical intervention.  Today, the patient does not remember anyone telling him he had a right renal mass from his scan at [facility] in September 2022.  He has never seen a urologist previously.  He did not drink while he was in prison, and reports rare alcohol over the last year, but prior to being incarcerated he reports he may drink as much as 5-6 shots per day.  He denies any gross hematuria.   PMH: Past Medical History:  Diagnosis Date   Arthritis    Diabetes mellitus without complication ([place])     Surgical History: Past Surgical History:  Procedure Laterality Date   FRACTURE SURGERY       Social History:  reports that he has been smoking cigars. He has never used smokeless tobacco. He reports that he does not currently use alcohol. He reports that he does not use drugs.  Physical Exam: BP 129/66 (BP Location: Left Arm, Patient Position: Sitting, Cuff Size:  Normal)    Pulse 91    Ht '5\' 8"'  (1.727 m)    Wt 172 lb (78 kg)    BMI 26.15 kg/m    Constitutional:  Alert and oriented, No acute distress. Cardiovascular: No clubbing, cyanosis, or edema. Respiratory: Normal respiratory effort, no increased work of breathing. 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  He was seen by general surgery here regarding his right inguinal hernia with ascites, and any intervention was deferred prior to addressing his other issues of hepatic cirrhosis and ascites of unclear etiology, right renal mass consistent with malignancy, and his uncontrolled diabetes.  I had a frank conversation with the patient about his hepatic cirrhosis and ascites complicating his care and making  him a high risk surgical candidate for any intervention.  We reviewed that renal mass the size would typically be managed with surgery, most commonly a partial nephrectomy, however he is better served at a tertiary care center with his comorbidities and cirrhosis.  We also need to refer him to GI for further evaluation of etiology of his cirrhosis, and PCP for management of his diabetes.  I also communicated these findings with Dr.  Christian Mate from general surgery.  Referral placed to GI for hepatic cirrhosis and ascites Referral placed to [facility] urology for consideration of partial versus radical right nephrectomy  Cole Madrid, MD 02/23/2021  [facility] [address] [place], [place] [address] [phone]

## 2021-02-27 ENCOUNTER — Telehealth: Payer: Self-pay

## 2021-02-27 NOTE — Telephone Encounter (Signed)
Called no answer and no voicemal

## 2021-04-03 ENCOUNTER — Other Ambulatory Visit: Payer: Self-pay

## 2021-04-03 ENCOUNTER — Ambulatory Visit (INDEPENDENT_AMBULATORY_CARE_PROVIDER_SITE_OTHER): Payer: 59 | Admitting: Nurse Practitioner

## 2021-04-03 ENCOUNTER — Encounter: Payer: Self-pay | Admitting: Nurse Practitioner

## 2021-04-03 ENCOUNTER — Other Ambulatory Visit: Payer: Self-pay | Admitting: Nurse Practitioner

## 2021-04-03 ENCOUNTER — Telehealth: Payer: Self-pay

## 2021-04-03 VITALS — BP 118/72 | HR 85 | Temp 98.5°F | Resp 18 | Ht 68.0 in | Wt 174.0 lb

## 2021-04-03 DIAGNOSIS — B86 Scabies: Secondary | ICD-10-CM

## 2021-04-03 DIAGNOSIS — R188 Other ascites: Secondary | ICD-10-CM

## 2021-04-03 DIAGNOSIS — D696 Thrombocytopenia, unspecified: Secondary | ICD-10-CM | POA: Diagnosis not present

## 2021-04-03 DIAGNOSIS — M059 Rheumatoid arthritis with rheumatoid factor, unspecified: Secondary | ICD-10-CM

## 2021-04-03 DIAGNOSIS — E1165 Type 2 diabetes mellitus with hyperglycemia: Secondary | ICD-10-CM | POA: Diagnosis not present

## 2021-04-03 DIAGNOSIS — D509 Iron deficiency anemia, unspecified: Secondary | ICD-10-CM

## 2021-04-03 DIAGNOSIS — K7460 Unspecified cirrhosis of liver: Secondary | ICD-10-CM

## 2021-04-03 MED ORDER — BLOOD GLUCOSE MONITORING SUPPL W/DEVICE KIT: 1.0000 | PACK | Freq: Every day | 0 refills | Status: AC

## 2021-04-03 MED ORDER — PERMETHRIN 5 % EX CREA: TOPICAL_CREAM | CUTANEOUS | 1 refills | Status: DC

## 2021-04-03 MED ORDER — GLIPIZIDE 5 MG PO TABS: 5.0000 mg | ORAL_TABLET | Freq: Two times a day (BID) | ORAL | 3 refills | Status: DC

## 2021-04-03 MED ORDER — BLOOD GLUCOSE TEST VI STRP: ORAL_STRIP | 3 refills | Status: AC

## 2021-04-03 NOTE — Telephone Encounter (Signed)
Copied from [place] [id number]. Topic: General - Other ?>> Apr 03, 2021 11:14 AM Valere Dross wrote: ?Reason for CRM: Pt called in stating he just left his appt today, and once the pt got to the pharmacy they told him that the strips were missing and to get the provider to send them over, please advise. ?

## 2021-04-03 NOTE — Progress Notes (Signed)
? ?BP 118/72   Pulse 85   Temp 98.5 ?F (36.9 ?C) (Oral)   Resp 18   Ht _0  (1.727 m)   Wt 174 lb (78.9 kg)   SpO2 98%   BMI 26.46 kg/m?   ? ?Subjective:  ? ? Patient ID: Cole Pierce, male    DOB: 1965-12-21, 56 y.o.   MRN: [id number] ? ?HPI: ?Cole Pierce is a 56 y.o. male ? ?Chief Complaint  ?Patient presents with  ? Establish Care  ? Rash  ?  Chest and legs  ? ?Establish care: He says that his last physical was a while ago. He says he saw GI, Dr. Croley([facility]), and urology Dr. Diamantina Providence. Referral placed by Dr. Chauncey Mann was for hematology for iv iron infusions and dermatology for skin rash.  ? ?DM:  He says he was put on metformin and was unable to tolerate it.  He says that it gave him really bad stomach pain and nausea.  Discussed different treatment options, will start with glipizide 5 mg daily.  He denies any polyuria, polydipsia or polyphagia.  Last A1C was 10.2 on 03/27/2021.  Will recheck in three months.  ? ?Rheumatoid Arthritis: He is currently taking methotrexate. He was seeing a rheumatologist in [place] but needs a new one. He has cirrhosis of the liver, recommended he stop taking methotrexate and see new rheumatologist. Referral placed.  ? ?Scabies: He says he has had this rash all over his body for a few weeks. He was seen at urgent care and treated for yeast. Rash continued.  He does have a rash between his toes and hands, up his arms and on his chest and back.  There does appear to be scabies tacks.  Will treat for scabies. He already has a referral to dermatology placed by Dr. Chauncey Mann.   ? ?Thrombocytopenia/ iron deficiency anemia: Referral for hematologist was placed by Dr. Chauncey Mann.  Patient says he tried to do oral ion supplements but was unable to tolerate. H/H 9.1/31.8, RBC 4.61, plt 102, ferritin 12. ? ?Cirrhosis of liver: He had a ct done on 01/18/2021 which showed hepatic cirrhosis, marked splenomegaly with small upper abdominal varices, cholethiasis. He saw GI Dr. Chauncey Mann  on 03/27/2021, he is going to have an EGD and colonoscopy. Patient says he used to drink before being in prison but stopped while he was in prison. He does report that he has started drinking again. Discussed that he needs to avoid alcohol. Stopped methotrexate because it is hepatic toxic. Placed referral to rheumatologist to discuss other treatment options.  ? ?Right renal mass:He saw Dr. Diamantina Providence on 02/23/2021 for right renal mass.  Ct showed Enhancing heterogeneous 5.4 x 4.3 x 4.7 cm right lower pole renal ?lesion extends and effaces both Gerota's fascia and the right psoas ?muscle however there appears to be preservation of fat planes ?between both structures likely reflecting abutment without invasion. He was was told that surgery is the treatment however he is a high risk surgical candidate. He was referred to a tertiary care center.  ? ?Relevant past medical, surgical, family and social history reviewed and updated as indicated. Interim medical history since our last visit reviewed. ?Allergies and medications reviewed and updated. ? ?Review of Systems ? ?Constitutional: Negative for fever or weight change.  ?Respiratory: Negative for cough and shortness of breath.   ?Cardiovascular: Negative for chest pain or palpitations.  ?Gastrointestinal: Negative for abdominal pain, no bowel changes. Positive for hernia ?Musculoskeletal: Negative for gait problem or  joint swelling.  ?Skin: positive for rash.  ?Neurological: Negative for dizziness or headache.  ?No other specific complaints in a complete review of systems (except as listed in HPI above).  ? ?   ?Objective:  ?  ?BP 118/72   Pulse 85   Temp 98.5 ?F (36.9 ?C) (Oral)   Resp 18   Ht _0  (1.727 m)   Wt 174 lb (78.9 kg)   SpO2 98%   BMI 26.46 kg/m?   ?Wt Readings from Last 3 Encounters:  ?04/03/21 174 lb (78.9 kg)  ?02/23/21 172 lb (78 kg)  ?01/19/21 180 lb 9.6 oz (81.9 kg)  ?  ?Physical Exam ? ?Constitutional: Patient appears well-developed and  well-nourished.  No distress.  ?HEENT: head atraumatic, normocephalic, pupils equal and reactive to light,  neck supple ?Cardiovascular: Normal rate, regular rhythm and normal heart sounds.  No murmur heard. No BLE edema. ?Pulmonary/Chest: Effort normal and breath sounds normal. No respiratory distress. ?Abdominal: Soft.  There is no tenderness, right inguinal hernia ?Skin: rash on chest, back, arms, legs, between fingers and toes ?Psychiatric: Patient has a normal mood and affect. behavior is normal. Judgment and thought content normal.  ? ?Results for orders placed or performed in visit on 02/23/21  ?Microscopic Examination  ? Urine  ?Result Value Ref Range  ? WBC, UA 0-5 0 - 5 /hpf  ? RBC None seen 0 - 2 /hpf  ? Epithelial Cells (non renal) 0-10 0 - 10 /hpf  ? Bacteria, UA None seen None seen/Few  ?Urinalysis, Complete  ?Result Value Ref Range  ? Specific Gravity, UA 1.025 1.005 - 1.030  ? pH, UA 6.0 5.0 - 7.5  ? Color, UA Yellow Yellow  ? Appearance Ur Clear Clear  ? Leukocytes,UA Negative Negative  ? Protein,UA Negative Negative/Trace  ? Glucose, UA 3+ (A) Negative  ? Ketones, UA Trace (A) Negative  ? RBC, UA Negative Negative  ? Bilirubin, UA Negative Negative  ? Urobilinogen, Ur 1.0 0.2 - 1.0 mg/dL  ? Nitrite, UA Negative Negative  ? Microscopic Examination See below:   ? ?   ?Assessment & Plan:  ? ?1. Scabies ? ?- permethrin (ELIMITE) 5 % cream; Apply to entire body other than face - let sit for 14 hours then wash off, may repeat in 1 week if still having symptoms  Dispense: 60 g; Refill: 1 ? ?2. Seropositive rheumatoid arthritis ([place]) ?-stop methotrexate ?- Ambulatory referral to Rheumatology ? ?3. Type 2 diabetes mellitus with hyperglycemia, without long-term current use of insulin ([place]) ? ?- Blood Glucose Monitoring Suppl w/Device KIT; 1 each by Does not apply route daily.  Dispense: 1 kit; Refill: 0 ?- glipiZIDE (GLUCOTROL) 5 MG tablet; Take 1 tablet (5 mg total) by mouth 2 (two) times daily before a  meal.  Dispense: 60 tablet; Refill: 3 ? ?4. Thrombocytopenia ([place]) ?-referral to hematology placed by GI ? ?5. Iron deficiency anemia, unspecified iron deficiency anemia type ?-referral to hematology placed by GI ? ?6. Cirrhosis of liver with ascites, unspecified hepatic cirrhosis type ([place]) ?-stop methotrexate ?-keep appointment with GI ? ?Follow up plan: ?Return in about 3 months (around 07/03/2021) for follow up. ? ? ? ? ? ?

## 2021-04-17 ENCOUNTER — Inpatient Hospital Stay: Payer: 59 | Attending: Oncology | Admitting: Oncology

## 2021-04-17 ENCOUNTER — Inpatient Hospital Stay: Payer: 59

## 2021-05-17 ENCOUNTER — Ambulatory Visit: Payer: Managed Care, Other (non HMO) | Admitting: Gastroenterology

## 2021-06-14 ENCOUNTER — Inpatient Hospital Stay: Payer: Commercial Managed Care - HMO

## 2021-06-14 ENCOUNTER — Inpatient Hospital Stay: Payer: Commercial Managed Care - HMO | Admitting: Oncology

## 2021-06-19 ENCOUNTER — Encounter
Admission: RE | Disposition: A | Discharge status: Home / Self Care | Payer: Self-pay | Source: Ambulatory Visit | Attending: Gastroenterology

## 2021-06-19 ENCOUNTER — Ambulatory Visit
Admission: RE | Admit: 2021-06-19 | Discharge: 2021-06-19 | Disposition: A | Discharge status: Home / Self Care | Payer: Commercial Managed Care - HMO | Source: Ambulatory Visit | Attending: Gastroenterology | Admitting: Gastroenterology

## 2021-06-19 ENCOUNTER — Encounter: Payer: Self-pay | Admitting: Anesthesiology

## 2021-06-19 DIAGNOSIS — Z01818 Encounter for other preprocedural examination: Secondary | ICD-10-CM | POA: Insufficient documentation

## 2021-06-19 DIAGNOSIS — Z538 Procedure and treatment not carried out for other reasons: Secondary | ICD-10-CM | POA: Diagnosis not present

## 2021-06-19 HISTORY — PX: COLONOSCOPY: SHX5424

## 2021-06-19 HISTORY — PX: ESOPHAGOGASTRODUODENOSCOPY: SHX5428

## 2021-06-19 HISTORY — DX: Unspecified cirrhosis of liver: K74.60

## 2021-06-19 HISTORY — DX: Iron deficiency anemia, unspecified: D50.9

## 2021-06-19 HISTORY — DX: Other ascites: R18.8

## 2021-06-19 HISTORY — DX: Splenomegaly, not elsewhere classified: R16.1

## 2021-06-19 HISTORY — DX: Unilateral inguinal hernia, without obstruction or gangrene, not specified as recurrent: K40.90

## 2021-06-19 HISTORY — DX: Malignant (primary) neoplasm, unspecified: C80.1

## 2021-06-19 HISTORY — DX: Malignant neoplasm of unspecified kidney, except renal pelvis: C64.9

## 2021-06-19 HISTORY — DX: Thrombocytopenia, unspecified: D69.6

## 2021-06-19 HISTORY — DX: Portal hypertension: K76.6

## 2021-06-19 SURGERY — COLONOSCOPY
Anesthesia: General

## 2021-06-19 MED ORDER — PROPOFOL 1000 MG/100ML IV EMUL
INTRAVENOUS | Status: AC
  Filled 2021-06-19: qty 100

## 2021-06-19 MED ORDER — LIDOCAINE HCL (PF) 2 % IJ SOLN
INTRAMUSCULAR | Status: AC
  Filled 2021-06-19: qty 5

## 2021-06-19 NOTE — Anesthesia Preprocedure Evaluation (Signed)
Anesthesia Evaluation  Patient identified by MRN, date of birth, ID band Patient awake    Reviewed: Allergy & Precautions, H&P , NPO status , Patient's Chart, lab work & pertinent test results, reviewed documented beta blocker date and time   Airway Mallampati: III   Neck ROM: full    Dental  (+) Poor Dentition   Pulmonary neg pulmonary ROS, Current Smoker,    Pulmonary exam normal        Cardiovascular Exercise Tolerance: Good negative cardio ROS Normal cardiovascular exam Rhythm:regular Rate:Normal     Neuro/Psych  Neuromuscular disease negative neurological ROS  negative psych ROS   GI/Hepatic negative GI ROS, Neg liver ROS, hiatal hernia,   Endo/Other  negative endocrine ROSdiabetes, Well Controlled, Type 2  Renal/GU Renal diseasenegative Renal ROS  negative genitourinary   Musculoskeletal   Abdominal   Peds  Hematology negative hematology ROS (+) Blood dyscrasia, anemia ,   Anesthesia Other Findings Past Medical History: No date: Arthritis No date: Ascites No date: Cancer (HCC) No date: Cirrhosis (HCC) No date: Diabetes mellitus without complication (HCC) No date: Hernia, hiatal No date: IDA (iron deficiency anemia) No date: Inguinal hernia     Comment:  right No date: Portal venous hypertension (HCC) No date: Renal cell carcinoma (HCC) No date: Splenomegaly No date: Thrombocytopenia ([place]) Past Surgical History: No date: FRACTURE SURGERY   Reproductive/Obstetrics negative OB ROS                             Anesthesia Physical Anesthesia Plan  ASA: 3  Anesthesia Plan: General   Post-op Pain Management:    Induction:   PONV Risk Score and Plan:   Airway Management Planned:   Additional Equipment:   Intra-op Plan:   Post-operative Plan:   Informed Consent: I have reviewed the patients History and Physical, chart, labs and discussed the procedure including the  risks, benefits and alternatives for the proposed anesthesia with the patient or authorized representative who has indicated his/her understanding and acceptance.     Dental Advisory Given  Plan Discussed with: CRNA  Anesthesia Plan Comments:         Anesthesia Quick Evaluation

## 2021-06-19 NOTE — OR Nursing (Signed)
Pt arrived to endo. Just finished prep at 1200pm then ate regular food all day yesterday with pizza at supper. Dr Haig Prophet spoke with patient and procedure cancelled

## 2021-06-20 ENCOUNTER — Encounter: Payer: Self-pay | Admitting: Gastroenterology

## 2021-06-20 ENCOUNTER — Telehealth (INDEPENDENT_AMBULATORY_CARE_PROVIDER_SITE_OTHER): Payer: 59 | Admitting: Nurse Practitioner

## 2021-06-20 DIAGNOSIS — R21 Rash and other nonspecific skin eruption: Secondary | ICD-10-CM | POA: Diagnosis not present

## 2021-06-20 DIAGNOSIS — B86 Scabies: Secondary | ICD-10-CM | POA: Diagnosis not present

## 2021-06-20 MED ORDER — PREDNISONE 10 MG (21) PO TBPK: ORAL_TABLET | ORAL | 0 refills | Status: DC

## 2021-06-20 MED ORDER — PERMETHRIN 5 % EX CREA: TOPICAL_CREAM | CUTANEOUS | 1 refills | Status: DC

## 2021-06-20 MED ORDER — HYDROXYZINE PAMOATE 25 MG PO CAPS: 25.0000 mg | ORAL_CAPSULE | Freq: Three times a day (TID) | ORAL | 0 refills | Status: DC | PRN

## 2021-06-20 NOTE — Progress Notes (Signed)
Name: Shaine Newmark   MRN: [id number]    DOB: 11/21/1965   Date:06/20/2021       Progress Note  Subjective  Chief Complaint  Chief Complaint  Patient presents with   Follow-up   Rash    Itches badly has got worst spreading to ears,face all over body. Some parts are red/white  Pt would like to discuss hep b shot    I connected with  Yaakov Juarbe  on 06/20/21 at  1:40 PM EDT by a telepphone enabled telemedicine application and verified that I am speaking with the correct person using two identifiers.  I discussed the limitations of evaluation and management by telemedicine and the availability of in person appointments. The patient expressed understanding and agreed to proceed with a virtual visit  Staff also discussed with the patient that there may be a patient responsible charge related to this service. Patient Location: home Provider Location: home Additional Individuals present: alone  HPI  Rash: Patient reports that he has had a rash all over for several months getting increasingly worse. Patient has previously been treated for scabies.  Patient reports that the rash never improved and has gotten worse.  He says it is all over now. Patient reports that he can pull something out of his skin and it is alive.  Difficult to evaluate in virtual appointment. Will send in another treatment of permethrin and vistaril and course of steroids.  Discussed if no improvement patient will need in person appointment.   Patient also needs to clean all bedding and clothing in hot water.   Patient Active Problem List   Diagnosis Date Noted   Type 2 diabetes mellitus without complication, without long-term current use of insulin ([place]) 01/19/2021   Cirrhosis of liver with ascites ([place]) 01/19/2021   Abdominal fluid collection 01/19/2021   Iron deficiency anemia 07/07/2020   Thrombocytopenia ([place]) 44/81/8563   Umbilical hernia 14/97/0263   Seropositive rheumatoid arthritis ([place]) 03/25/2020     Social History   Tobacco Use   Smoking status: Some Days    Types: Cigars   Smokeless tobacco: Never  Substance Use Topics   Alcohol use: Not Currently    Comment: had quit for 13 years and recently started back     Current Outpatient Medications:    Blood Glucose Monitoring Suppl w/Device KIT, 1 each by Does not apply route daily., Disp: 1 kit, Rfl: 0   glipiZIDE (GLUCOTROL) 5 MG tablet, Take 1 tablet (5 mg total) by mouth 2 (two) times daily before a meal., Disp: 60 tablet, Rfl: 3   Glucose Blood (BLOOD GLUCOSE TEST STRIPS) STRP, Use as directed to monitor FSBS once daily for DM, Disp: 100 strip, Rfl: 3   hydrOXYzine (VISTARIL) 25 MG capsule, Take 1 capsule (25 mg total) by mouth every 8 (eight) hours as needed for itching., Disp: 30 capsule, Rfl: 0   Methotrexate Sodium (METHOTREXATE, PF,) 50 MG/2ML injection, as directed., Disp: , Rfl:    predniSONE (STERAPRED UNI-PAK 21 TAB) 10 MG (21) TBPK tablet, Take as directed on package.  (60 mg po on day 1, 50 mg po on day 2...), Disp: 21 tablet, Rfl: 0   permethrin (ELIMITE) 5 % cream, Apply to entire body other than face - let sit for 14 hours then wash off, may repeat in 1 week if still having symptoms, Disp: 60 g, Rfl: 1  Allergies  Allergen Reactions   Zantac [Ranitidine Hcl] Anaphylaxis   Penicillins Hives    Hives in  mouth    I personally reviewed active problem list, medication list, allergies, notes from last encounter with the patient/caregiver today.  ROS  Constitutional: Negative for fever or weight change.  Respiratory: Negative for cough and shortness of breath.   Cardiovascular: Negative for chest pain or palpitations.  Gastrointestinal: Negative for abdominal pain, no bowel changes.  Musculoskeletal: Negative for gait problem or joint swelling.  Skin:positive for rash.  Neurological: Negative for dizziness or headache.  No other specific complaints in a complete review of systems (except as listed in HPI  above).   Objective  Virtual encounter, vitals not obtained.  There is no height or weight on file to calculate BMI.  Nursing Note and Vital Signs reviewed.  Physical Exam  Awake, alert and oriented, speaking in complete sentences.   No results found for this or any previous visit (from the past 72 hour(s)).  Assessment & Plan  1. Rash Comments: continued worsening rash, will treat with another course of permethrin, prednisone and hydroxyzine.  - hydrOXYzine (VISTARIL) 25 MG capsule; Take 1 capsule (25 mg total) by mouth every 8 (eight) hours as needed for itching.  Dispense: 30 capsule; Refill: 0 - predniSONE (STERAPRED UNI-PAK 21 TAB) 10 MG (21) TBPK tablet; Take as directed on package.  (60 mg po on day 1, 50 mg po on day 2...)  Dispense: 21 tablet; Refill: 0  2. Scabies Comments: continued worsening rash, will treat with another course of permethrin, prednisone and hydroxyzine. wash all linens in hot water - permethrin (ELIMITE) 5 % cream; Apply to entire body other than face - let sit for 14 hours then wash off, may repeat in 1 week if still having symptoms  Dispense: 60 g; Refill: 1    -Red flags and when to present for emergency care or RTC including fever >101.37F, chest pain, shortness of breath, new/worsening/un-resolving symptoms,  reviewed with patient at time of visit. Follow up and care instructions discussed and provided in AVS. - I discussed the assessment and treatment plan with the patient. The patient was provided an opportunity to ask questions and all were answered. The patient agreed with the plan and demonstrated an understanding of the instructions.  I provided 20 minutes of non-face-to-face time during this encounter.  Bo Merino, FNP

## 2021-06-26 ENCOUNTER — Encounter: Payer: Self-pay | Admitting: Gastroenterology

## 2021-06-28 ENCOUNTER — Ambulatory Visit: Payer: 59 | Admitting: Nurse Practitioner

## 2021-06-28 NOTE — Progress Notes (Deleted)
   There were no vitals taken for this visit.   Subjective:    Patient ID: Cole Pierce, male    DOB: 1965/05/02, 56 y.o.   MRN: [id number]  HPI: Cole Pierce is a 56 y.o. male  No chief complaint on file.  Hep B immunization:  Patient is here today to get his hep B immunization.  He recently had labs done that showed he did not have he was non reactive to Hepatitis B antigen, antibody or Qual.  Will start Hep B series.    Scabies/rash:   Diabetes:  Cirrhosis of the liver:  Rheumatoid arthritis:  Thrombocytopenia/anemia:   Relevant past medical, surgical, family and social history reviewed and updated as indicated. Interim medical history since our last visit reviewed. Allergies and medications reviewed and updated.  Review of Systems  Per HPI unless specifically indicated above     Objective:    There were no vitals taken for this visit.  Wt Readings from Last 3 Encounters:  04/03/21 174 lb (78.9 kg)  02/23/21 172 lb (78 kg)  01/19/21 180 lb 9.6 oz (81.9 kg)    Physical Exam  Results for orders placed or performed in visit on 02/23/21  Microscopic Examination   Urine  Result Value Ref Range   WBC, UA 0-5 0 - 5 /hpf   RBC None seen 0 - 2 /hpf   Epithelial Cells (non renal) 0-10 0 - 10 /hpf   Bacteria, UA None seen None seen/Few  Urinalysis, Complete  Result Value Ref Range   Specific Gravity, UA 1.025 1.005 - 1.030   pH, UA 6.0 5.0 - 7.5   Color, UA Yellow Yellow   Appearance Ur Clear Clear   Leukocytes,UA Negative Negative   Protein,UA Negative Negative/Trace   Glucose, UA 3+ (A) Negative   Ketones, UA Trace (A) Negative   RBC, UA Negative Negative   Bilirubin, UA Negative Negative   Urobilinogen, Ur 1.0 0.2 - 1.0 mg/dL   Nitrite, UA Negative Negative   Microscopic Examination See below:       Assessment & Plan:   Problem List Items Addressed This Visit   None    Follow up plan: No follow-ups on file.

## 2021-07-03 ENCOUNTER — Encounter: Payer: Self-pay | Admitting: Nurse Practitioner

## 2021-07-03 ENCOUNTER — Ambulatory Visit (INDEPENDENT_AMBULATORY_CARE_PROVIDER_SITE_OTHER): Payer: Commercial Managed Care - HMO | Admitting: Nurse Practitioner

## 2021-07-03 VITALS — BP 118/72 | HR 83 | Temp 98.0°F | Resp 18 | Ht 68.0 in | Wt 166.7 lb

## 2021-07-03 DIAGNOSIS — Z23 Encounter for immunization: Secondary | ICD-10-CM | POA: Diagnosis not present

## 2021-07-03 DIAGNOSIS — K4091 Unilateral inguinal hernia, without obstruction or gangrene, recurrent: Secondary | ICD-10-CM

## 2021-07-03 DIAGNOSIS — E1165 Type 2 diabetes mellitus with hyperglycemia: Secondary | ICD-10-CM

## 2021-07-03 DIAGNOSIS — Z1159 Encounter for screening for other viral diseases: Secondary | ICD-10-CM

## 2021-07-03 DIAGNOSIS — Z114 Encounter for screening for human immunodeficiency virus [HIV]: Secondary | ICD-10-CM

## 2021-07-03 DIAGNOSIS — M059 Rheumatoid arthritis with rheumatoid factor, unspecified: Secondary | ICD-10-CM

## 2021-07-03 DIAGNOSIS — D696 Thrombocytopenia, unspecified: Secondary | ICD-10-CM | POA: Diagnosis not present

## 2021-07-03 DIAGNOSIS — R188 Other ascites: Secondary | ICD-10-CM

## 2021-07-03 DIAGNOSIS — E119 Type 2 diabetes mellitus without complications: Secondary | ICD-10-CM

## 2021-07-03 DIAGNOSIS — D509 Iron deficiency anemia, unspecified: Secondary | ICD-10-CM

## 2021-07-03 DIAGNOSIS — K7460 Unspecified cirrhosis of liver: Secondary | ICD-10-CM

## 2021-07-03 DIAGNOSIS — B86 Scabies: Secondary | ICD-10-CM

## 2021-07-03 DIAGNOSIS — R21 Rash and other nonspecific skin eruption: Secondary | ICD-10-CM

## 2021-07-03 NOTE — Assessment & Plan Note (Signed)
Reports he has been taking glipizide 5 mg daily.  We are getting lab work today.

## 2021-07-03 NOTE — Addendum Note (Signed)
Addended by: Docia Furl on: 07/03/2021 10:31 AM   Modules accepted: Orders

## 2021-07-03 NOTE — Assessment & Plan Note (Signed)
Referral placed for hematology.

## 2021-07-03 NOTE — Progress Notes (Addendum)
BP 118/72   Pulse 83   Temp 98 F (36.7 C) (Oral)   Resp 18   Ht '5\' 8"'$  (1.727 m)   Wt 166 lb 11.2 oz (75.6 kg)   SpO2 98%   BMI 25.35 kg/m    Subjective:    Patient ID: Cole Pierce, male    DOB: March 02, 1965, 56 y.o.   MRN: [id number]  HPI: Cole Pierce is a 56 y.o. male  Chief Complaint  Patient presents with   Follow-up   Diabetes   Rash   Diabetes: Patient's last A1c was 10.2 on 03/27/2021.  Patient had been put on metformin but was unable to tolerate it.  He stated that he had really bad stomach pain and nausea.  When I saw him on 04/03/2021 he started glipizide 5 mg daily.  He denies any polyuria, polydipsia or polyphasia.  We will get labs today.  Thrombocytopenia/ iron deficiency anemia: Patient said he was referred to hematology by Dr. Chauncey Mann but says he has not heard anything.  Will place new referral.   Rheumatoid arthritis: Taking methotrexate.  He was seeing a rheumatologist in [facility] but a referral was placed for 1 close to here.  He has cirrhosis of the liver.  Patient did stop taking methotrexate due to this.  Patient has not been to see rheumatologist yet.  Gave patient rheumatology office information so he can call and schedule an appointment.  Cirrhosis of liver: Dr. Chauncey Mann [facility]) He was supposed to see her today but had to reschedule.  Patient states he had a CT done on January 18, 2021 which showed hepatic cirrhosis, marked splenomegaly with small upper abdominal varices, cholelithiasis.  He saw GI Dr. Freada Bergeron on 03/27/2021, he is going to have an EGD and colonoscopy.  Patient says he used to drink before being in prison but has stopped and has recently restarted again.  Discussed that he needs to avoid alcohol stopped methotrexate because of it is hepatic toxic.  Scabies/pulling bugs out of skin/rash: Patient continues to have itching over his body.  Patient states he has been pulling bugs out and brought in a jar.  We will place in specimen cup and  sent to lab for identification.  Patient has tried treatment multiple times with no success.  Referral placed to dermatology.  Inguinal hernia: Patient reports he had a right inguinal hernia for several years.  Patient states his has become troublesome.  Referral placed for surgery.    07/03/2021    9:37 AM 06/20/2021    1:31 PM 04/03/2021    9:32 AM  Depression screen PHQ 2/9  Decreased Interest 0 0 0  Down, Depressed, Hopeless 0 0 0  PHQ - 2 Score 0 0 0  Altered sleeping 0 0   Tired, decreased energy 0 0   Change in appetite 0 0   Feeling bad or failure about yourself  0 0   Trouble concentrating 0 0   Moving slowly or fidgety/restless 0 0   Suicidal thoughts 0 0   PHQ-9 Score 0 0   Difficult doing work/chores Not difficult at all Not difficult at all     Relevant past medical, surgical, family and social history reviewed and updated as indicated. Interim medical history since our last visit reviewed. Allergies and medications reviewed and updated.   Review of Systems Constitutional: Negative for fever or weight change.  Respiratory: Negative for cough and shortness of breath.   Cardiovascular: Negative for chest pain or palpitations.  Gastrointestinal: Negative for abdominal pain, no bowel changes.  Positive for right inguinal hernia Musculoskeletal: Negative for gait problem or joint swelling.  Skin: positive for rash.  Neurological: Negative for dizziness or headache.  No other specific complaints in a complete review of systems (except as listed in HPI above).      Objective:    BP 118/72   Pulse 83   Temp 98 F (36.7 C) (Oral)   Resp 18   Ht '5\' 8"'$  (1.727 m)   Wt 166 lb 11.2 oz (75.6 kg)   SpO2 98%   BMI 25.35 kg/m   Wt Readings from Last 3 Encounters:  07/03/21 166 lb 11.2 oz (75.6 kg)  04/03/21 174 lb (78.9 kg)  02/23/21 172 lb (78 kg)    Physical Exam  Constitutional: Patient appears well-developed and well-nourished.  No distress.  HEENT: head atraumatic,  normocephalic, pupils equal and reactive to light,  neck supple Cardiovascular: Normal rate, regular rhythm and normal heart sounds.  No murmur heard. No BLE edema. Pulmonary/Chest: Effort normal and breath sounds normal. No respiratory distress. Abdominal: Soft.  There is no tenderness. Psychiatric: Patient has a normal mood and affect. behavior is normal. Judgment and thought content normal.  Results for orders placed or performed in visit on 02/23/21  Microscopic Examination   Urine  Result Value Ref Range   WBC, UA 0-5 0 - 5 /hpf   RBC, Urine None seen 0 - 2 /hpf   Epithelial Cells (non renal) 0-10 0 - 10 /hpf   Bacteria, UA None seen None seen/Few  Urinalysis, Complete  Result Value Ref Range   Specific Gravity, UA 1.025 1.005 - 1.030   pH, UA 6.0 5.0 - 7.5   Color, UA Yellow Yellow   Appearance Ur Clear Clear   Leukocytes,UA Negative Negative   Protein,UA Negative Negative/Trace   Glucose, UA 3+ (A) Negative   Ketones, UA Trace (A) Negative   RBC, UA Negative Negative   Bilirubin, UA Negative Negative   Urobilinogen, Ur 1.0 0.2 - 1.0 mg/dL   Nitrite, UA Negative Negative   Microscopic Examination See below:       Assessment & Plan:   Problem List Items Addressed This Visit       Digestive   Cirrhosis of liver with ascites [facility])    Patient is going to call and reschedule GI appointment.        Endocrine   Type 2 diabetes mellitus without complication, without long-term current use of insulin ([place])    Reports he has been taking glipizide 5 mg daily.  We are getting lab work today.        Musculoskeletal and Integument   Seropositive rheumatoid arthritis ([place])    Given information for rheumatoid office to call and make appointment.        Hematopoietic and Hemostatic   Thrombocytopenia [facility])    Referral placed for hematology.      Relevant Orders   Ambulatory referral to Hematology / Oncology   CBC with Differential/Platelet     Other   Iron  deficiency anemia    Furl placed for hematology.      Relevant Orders   Ambulatory referral to Hematology / Oncology   CBC with Differential/Platelet   Other Visit Diagnoses     Type 2 diabetes mellitus with hyperglycemia, without long-term current use of insulin ([place])    -  Primary   Relevant Orders   COMPLETE METABOLIC PANEL WITH GFR   Hemoglobin  A1c   Microalbumin / creatinine urine ratio   HM Diabetes Foot Exam (Completed)   Ambulatory referral to Optometry   Scabies       Specimens placed in specimen cup and sent to lab for identification.   Relevant Orders   Arthropod Identification   Encounter for hepatitis C screening test for low risk patient       Relevant Orders   Hepatitis C antibody   Screening for HIV without presence of risk factors       Relevant Orders   HIV Antibody (routine testing w rflx)   Unilateral recurrent inguinal hernia without obstruction or gangrene       Referral placed to surgery.   Relevant Orders   Ambulatory referral to General Surgery   Rash       Referral placed to dermatology.   Relevant Orders   Ambulatory referral to Dermatology   Need for hepatitis B vaccination       Relevant Orders   Hepatitis B vaccine adult IM        Follow up plan: Return in about 4 months (around 11/03/2021) for follow up, 2 months for second hep b.

## 2021-07-03 NOTE — Assessment & Plan Note (Signed)
Given information for rheumatoid office to call and make appointment.

## 2021-07-03 NOTE — Addendum Note (Signed)
Addended by: Serafina Royals F on: 07/03/2021 10:24 AM   Modules accepted: Orders

## 2021-07-03 NOTE — Assessment & Plan Note (Signed)
Patient is going to call and reschedule GI appointment.

## 2021-07-03 NOTE — Assessment & Plan Note (Signed)
Furl placed for hematology.

## 2021-07-05 ENCOUNTER — Other Ambulatory Visit: Payer: Self-pay | Admitting: Nurse Practitioner

## 2021-07-05 ENCOUNTER — Telehealth: Payer: Self-pay | Admitting: *Deleted

## 2021-07-05 DIAGNOSIS — E1165 Type 2 diabetes mellitus with hyperglycemia: Secondary | ICD-10-CM

## 2021-07-05 MED ORDER — DAPAGLIFLOZIN PROPANEDIOL 5 MG PO TABS: 5.0000 mg | ORAL_TABLET | Freq: Every day | ORAL | 1 refills | Status: DC

## 2021-07-05 NOTE — Telephone Encounter (Signed)
Patient returned call regarding lab results and was notified: Cole Pierce, your blood counts show anemia.  It is important that she get in with hematology.  I did that referral at your last visit.  Your kidney and liver function are normal.  Your A1c which checks your diabetes is 8.6.  This shows that your diabetes is still not well controlled.  I am going to  send in another medication called Farxiga to help with your diabetes.

## 2021-07-06 ENCOUNTER — Ambulatory Visit (INDEPENDENT_AMBULATORY_CARE_PROVIDER_SITE_OTHER): Payer: Commercial Managed Care - HMO | Admitting: Dermatology

## 2021-07-06 DIAGNOSIS — R21 Rash and other nonspecific skin eruption: Secondary | ICD-10-CM

## 2021-07-06 DIAGNOSIS — D235 Other benign neoplasm of skin of trunk: Secondary | ICD-10-CM | POA: Diagnosis not present

## 2021-07-06 DIAGNOSIS — L299 Pruritus, unspecified: Secondary | ICD-10-CM

## 2021-07-06 DIAGNOSIS — D239 Other benign neoplasm of skin, unspecified: Secondary | ICD-10-CM

## 2021-07-06 MED ORDER — TRIAMCINOLONE ACETONIDE 0.1 % EX CREA: 1.0000 | TOPICAL_CREAM | Freq: Two times a day (BID) | CUTANEOUS | 0 refills | Status: DC | PRN

## 2021-07-06 MED ORDER — HYDROCORTISONE 2.5 % EX CREA: TOPICAL_CREAM | CUTANEOUS | 0 refills | Status: DC

## 2021-07-06 NOTE — Patient Instructions (Addendum)
Wound Care Instructions  Cleanse wound gently with soap and water once a day then pat dry with clean gauze. Apply a thing coat of Petrolatum (petroleum jelly, "Vaseline") over the wound (unless you have an allergy to this). We recommend that you use a new, sterile tube of Vaseline. Do not pick or remove scabs. Do not remove the yellow or white "healing tissue" from the base of the wound.  Cover the wound with fresh, clean, nonstick gauze and secure with paper tape. You may use Band-Aids in place of gauze and tape if the would is small enough, but would recommend trimming much of the tape off as there is often too much. Sometimes Band-Aids can irritate the skin.  You should call the office for your biopsy report after 1 week if you have not already been contacted.  If you experience any problems, such as abnormal amounts of bleeding, swelling, significant bruising, significant pain, or evidence of infection, please call the office immediately.  FOR ADULT SURGERY PATIENTS: If you need something for pain relief you may take 1 extra strength Tylenol (acetaminophen) AND 2 Ibuprofen (200mg each) together every 4 hours as needed for pain. (do not take these if you are allergic to them or if you have a reason you should not take them.) Typically, you may only need pain medication for 1 to 3 days.    Due to recent changes in healthcare laws, you may see results of your pathology and/or laboratory studies on MyChart before the doctors have had a chance to review them. We understand that in some cases there may be results that are confusing or concerning to you. Please understand that not all results are received at the same time and often the doctors may need to interpret multiple results in order to provide you with the best plan of care or course of treatment. Therefore, we ask that you please give us 2 business days to thoroughly review all your results before contacting the office for clarification. Should we  see a critical lab result, you will be contacted sooner.   If You Need Anything After Your Visit  If you have any questions or concerns for your doctor, please call our main line at [phone] and press option 4 to reach your doctor's medical assistant. If no one answers, please leave a voicemail as directed and we will return your call as soon as possible. Messages left after 4 pm will be answered the following business day.   You may also send us a message via MyChart. We typically respond to MyChart messages within 1-2 business days.  For prescription refills, please ask your pharmacy to contact our office. Our fax number is [phone].  If you have an urgent issue when the clinic is closed that cannot wait until the next business day, you can page your doctor at the number below.    Please note that while we do our best to be available for urgent issues outside of office hours, we are not available 24/7.   If you have an urgent issue and are unable to reach us, you may choose to seek medical care at your doctor's office, retail clinic, urgent care center, or emergency room.  If you have a medical emergency, please immediately call 911 or go to the emergency department.  Pager Numbers  - Dr. Kowalski: [phone]  - Dr. Moye: [phone]  - Dr. Stewart: [phone]  In the event of inclement weather, please call our main line at [phone]   for an update on the status of any delays or closures.  Dermatology Medication Tips: Please keep the boxes that topical medications come in in order to help keep track of the instructions about where and how to use these. Pharmacies typically print the medication instructions only on the boxes and not directly on the medication tubes.   If your medication is too expensive, please contact our office at [phone] option 4 or send us a message through MyChart.   We are unable to tell what your co-pay for medications will be in advance  as this is different depending on your insurance coverage. However, we may be able to find a substitute medication at lower cost or fill out paperwork to get insurance to cover a needed medication.   If a prior authorization is required to get your medication covered by your insurance company, please allow us 1-2 business days to complete this process.  Drug prices often vary depending on where the prescription is filled and some pharmacies may offer cheaper prices.  The website www.goodrx.com contains coupons for medications through different pharmacies. The prices here do not account for what the cost may be with help from insurance (it may be cheaper with your insurance), but the website can give you the price if you did not use any insurance.  - You can print the associated coupon and take it with your prescription to the pharmacy.  - You may also stop by our office during regular business hours and pick up a GoodRx coupon card.  - If you need your prescription sent electronically to a different pharmacy, notify our office through [facility] MyChart or by phone at [phone] option 4.     Si Usted Necesita Algo Despus de Su Visita  Tambin puede enviarnos un mensaje a travs de MyChart. Por lo general respondemos a los mensajes de MyChart en el transcurso de 1 a 2 das hbiles.  Para renovar recetas, por favor pida a su farmacia que se ponga en contacto con nuestra oficina. Nuestro nmero de fax es el [phone].  Si tiene un asunto urgente cuando la clnica est cerrada y que no puede esperar hasta el siguiente da hbil, puede llamar/localizar a su doctor(a) al nmero que aparece a continuacin.   Por favor, tenga en cuenta que aunque hacemos todo lo posible para estar disponibles para asuntos urgentes fuera del horario de oficina, no estamos disponibles las 24 horas del da, los 7 das de la semana.   Si tiene un problema urgente y no puede comunicarse con nosotros, puede optar  por buscar atencin mdica  en el consultorio de su doctor(a), en una clnica privada, en un centro de atencin urgente o en una sala de emergencias.  Si tiene una emergencia mdica, por favor llame inmediatamente al 911 o vaya a la sala de emergencias.  Nmeros de bper  - Dr. Kowalski: [phone]  - Dra. Moye: [phone]  - Dra. Stewart: [phone]  En caso de inclemencias del tiempo, por favor llame a nuestra lnea principal al [phone] para una actualizacin sobre el estado de cualquier retraso o cierre.  Consejos para la medicacin en dermatologa: Por favor, guarde las cajas en las que vienen los medicamentos de uso tpico para ayudarle a seguir las instrucciones sobre dnde y cmo usarlos. Las farmacias generalmente imprimen las instrucciones del medicamento slo en las cajas y no directamente en los tubos del medicamento.   Si su medicamento es muy caro, por favor, pngase en contacto con nuestra   oficina llamando al [phone] y presione la opcin 4 o envenos un mensaje a travs de MyChart.   No podemos decirle cul ser su copago por los medicamentos por adelantado ya que esto es diferente dependiendo de la cobertura de su seguro. Sin embargo, es posible que podamos encontrar un medicamento sustituto a menor costo o llenar un formulario para que el seguro cubra el medicamento que se considera necesario.   Si se requiere una autorizacin previa para que su compaa de seguros cubra su medicamento, por favor permtanos de 1 a 2 das hbiles para completar este proceso.  Los precios de los medicamentos varan con frecuencia dependiendo del lugar de dnde se surte la receta y alguna farmacias pueden ofrecer precios ms baratos.  El sitio web www.goodrx.com tiene cupones para medicamentos de diferentes farmacias. Los precios aqu no tienen en cuenta lo que podra costar con la ayuda del seguro (puede ser ms barato con su seguro), pero el sitio web puede darle el precio si  no utiliz ningn seguro.  - Puede imprimir el cupn correspondiente y llevarlo con su receta a la farmacia.  - Tambin puede pasar por nuestra oficina durante el horario de atencin regular y recoger una tarjeta de cupones de GoodRx.  - Si necesita que su receta se enve electrnicamente a una farmacia diferente, informe a nuestra oficina a travs de MyChart de [facility] o por telfono llamando al [phone] y presione la opcin 4.  

## 2021-07-06 NOTE — Progress Notes (Unsigned)
New Patient Visit  Subjective  Cole Pierce is a 56 y.o. male who presents for the following: Rash (Patient c/o bugs in his ears, eyes, and skin - itches all the time from his head to his feet. Some insects appear to be worms and some look like little bugs, they move around. His sister also has the same bugs and he has removed some from her hair but they aren't as bothersome for her as they are to him. He has photos to show on his phone of the bugs that he's removed from his skin. He has been treated in the past for scabies - Permethrin, Hydroxyzine, and Prednisone but none of those has helped).   The following portions of the chart were reviewed this encounter and updated as appropriate:   Tobacco  Allergies  Meds  Problems  Med Hx  Surg Hx  Fam Hx      Review of Systems:  No other skin or systemic complaints except as noted in HPI or Assessment and Plan.  Objective  Well appearing patient in no apparent distress; mood and affect are within normal limits.  A focused examination was performed including face, scalp, ears, arms, trunk, and hands. Relevant physical exam findings are noted in the Assessment and Plan.  R upper back inf, R upper back sup Multiple excoriations to the hands, arms, chest, neck. Multiple scars and some excoriations.              Scrotum Purple papules    Assessment & Plan  Rash R upper back inf; R upper back sup    Skin / nail biopsy - R upper back inf Type of biopsy: punch   Informed consent: discussed and consent obtained   Timeout: patient name, date of birth, surgical site, and procedure verified   Procedure prep:  Patient was prepped and draped in usual sterile fashion (the patient was cleaned and prepped) Prep type:  Isopropyl alcohol Anesthesia: the lesion was anesthetized in a standard fashion   Anesthetic:  1% lidocaine w/ epinephrine 1-100,000 buffered w/ 8.4% NaHCO3 Punch size:  4 mm Suture size:  4-0 Suture type:  Prolene (polypropylene)   Hemostasis achieved with: suture, pressure and aluminum chloride   Outcome: patient tolerated procedure well   Post-procedure details: sterile dressing applied and wound care instructions given   Dressing type: bandage, petrolatum and pressure dressing    Skin / nail biopsy - R upper back inf, R upper back sup Type of biopsy: punch   Informed consent: discussed and consent obtained   Timeout: patient name, date of birth, surgical site, and procedure verified   Procedure prep:  Patient was prepped and draped in usual sterile fashion (the patient was cleaned and prepped) Prep type:  Isopropyl alcohol Anesthesia: the lesion was anesthetized in a standard fashion   Anesthetic:  1% lidocaine w/ epinephrine 1-100,000 buffered w/ 8.4% NaHCO3 Punch size:  3.5 mm Suture size:  4-0 Suture type: nylon   Hemostasis achieved with: suture, pressure and aluminum chloride   Outcome: patient tolerated procedure well   Post-procedure details: sterile dressing applied and wound care instructions given   Dressing type: bandage, petrolatum and pressure dressing    Specimen 1 - Surgical pathology Differential Diagnosis: D48.5 patient reports history of bugs crawling out of skin and itch r/o spongiotic dermatitis vs hypersensitivity reaction vs other  Check Margins: No  Specimen 2 - Surgical pathology Differential Diagnosis: D48.5 patient reports history of bugs crawling out of skin and  itch r/o spongiotic dermatitis vs hypersensitivity reaction vs other  Check Margins: No  Related Medications hydrOXYzine (VISTARIL) 25 MG capsule Take 1 capsule (25 mg total) by mouth every 8 (eight) hours as needed for itching.  triamcinolone cream (KENALOG) 0.1 % Apply 1 Application topically 2 (two) times daily as needed. Avoid applying to face, groin, and axilla. Use as directed. Long-term use can cause thinning of the skin.  hydrocortisone 2.5 % cream Apply to itchy rash on the face,  groin, and underarms BID up to two weeks.  Angiokeratoma Scrotum  Benign appearing but can be itchy and symptomatic. May [place] HC 2.5% cream twice a day as needed up to 2 weeks as prescribed for rash.  Itch Right Shoulder - Posterior  Start TMC 0.1% cream to aa's itchy rash BID. Avoid applying to face, groin, and axilla. Use as directed. Long-term use can cause thinning of the skin.  Start HC 2.5% cream to aa's face and groin.   Topical steroids (such as triamcinolone, fluocinolone, fluocinonide, mometasone, clobetasol, halobetasol, betamethasone, hydrocortisone) can cause thinning and lightening of the skin if they are used for too long in the same area. Your physician has selected the right strength medicine for your problem and area affected on the body. Please use your medication only as directed by your physician to prevent side effects.      Return in about 2 weeks (around 07/20/2021) for follow up and suture removal.  I, Rudell Cobb, CMA, am acting as scribe for Forest Gleason, MD .  Documentation: I have reviewed the above documentation for accuracy and completeness, and I agree with the above.  Forest Gleason, MD

## 2021-07-10 LAB — COMPLETE METABOLIC PANEL WITH GFR
AG Ratio: 1 (calc) (ref 1.0–2.5)
ALT: 14 U/L (ref 9–46)
AST: 23 U/L (ref 10–35)
Albumin: 3.5 g/dL — ABNORMAL LOW (ref 3.6–5.1)
Alkaline phosphatase (APISO): 63 U/L (ref 35–144)
BUN: 14 mg/dL (ref 7–25)
CO2: 25 mmol/L (ref 20–32)
Calcium: 8.6 mg/dL (ref 8.6–10.3)
Chloride: 108 mmol/L (ref 98–110)
Creat: 0.79 mg/dL (ref 0.70–1.30)
Globulin: 3.5 g/dL (calc) (ref 1.9–3.7)
Glucose, Bld: 205 mg/dL — ABNORMAL HIGH (ref 65–99)
Potassium: 3.9 mmol/L (ref 3.5–5.3)
Sodium: 140 mmol/L (ref 135–146)
Total Bilirubin: 1.5 mg/dL — ABNORMAL HIGH (ref 0.2–1.2)
Total Protein: 7 g/dL (ref 6.1–8.1)
eGFR: 104 mL/min/{1.73_m2} (ref >=60)

## 2021-07-10 LAB — HIV ANTIBODY (ROUTINE TESTING W REFLEX): HIV 1&2 Ab, 4th Generation: NONREACTIVE

## 2021-07-10 LAB — CBC WITH DIFFERENTIAL/PLATELET
Absolute Monocytes: 455 cells/uL (ref 200–950)
Basophils Absolute: 49 cells/uL (ref 0–200)
Basophils Relative: 1.4 %
Eosinophils Absolute: 263 cells/uL (ref 15–500)
Eosinophils Relative: 7.5 %
HCT: 30.3 % — ABNORMAL LOW (ref 38.5–50.0)
Hemoglobin: 8.8 g/dL — ABNORMAL LOW (ref 13.2–17.1)
Lymphs Abs: 746 cells/uL — ABNORMAL LOW (ref 850–3900)
MCH: 19 pg — ABNORMAL LOW (ref 27.0–33.0)
MCHC: 29 g/dL — ABNORMAL LOW (ref 32.0–36.0)
MCV: 65.3 fL — ABNORMAL LOW (ref 80.0–100.0)
Monocytes Relative: 13 %
Neutro Abs: 1988 cells/uL (ref 1500–7800)
Neutrophils Relative %: 56.8 %
Platelets: 112 10*3/uL — ABNORMAL LOW (ref 140–400)
RBC: 4.64 10*6/uL (ref 4.20–5.80)
RDW: 18 % — ABNORMAL HIGH (ref 11.0–15.0)
Total Lymphocyte: 21.3 %
WBC: 3.5 10*3/uL — ABNORMAL LOW (ref 3.8–10.8)

## 2021-07-10 LAB — ARTHROPOD IDENTIFICATION
MICRO NUMBER: 13600898
RESULT: NOT DETECTED
SPECIMEN QUALITY: ADEQUATE

## 2021-07-10 LAB — MICROALBUMIN / CREATININE URINE RATIO
Creatinine, Urine: 229 mg/dL (ref 20–320)
Microalb Creat Ratio: 7 mcg/mg creat (ref <30)
Microalb, Ur: 1.7 mg/dL

## 2021-07-10 LAB — HEMOGLOBIN A1C
Hgb A1c MFr Bld: 8.6 % of total Hgb — ABNORMAL HIGH (ref <5.7)
Mean Plasma Glucose: 200 mg/dL
eAG (mmol/L): 11.1 mmol/L

## 2021-07-10 LAB — HEPATITIS C ANTIBODY: Hepatitis C Ab: NONREACTIVE

## 2021-07-10 LAB — CBC MORPHOLOGY

## 2021-07-13 ENCOUNTER — Ambulatory Visit: Payer: Commercial Managed Care - HMO | Admitting: Surgery

## 2021-07-17 NOTE — Telephone Encounter (Signed)
Patient called for lab results and notidied remaining result:  Cole Pierce, no bugs were found on the sample submitted.

## 2021-07-18 ENCOUNTER — Encounter: Payer: Self-pay | Admitting: Dermatology

## 2021-07-18 ENCOUNTER — Ambulatory Visit (INDEPENDENT_AMBULATORY_CARE_PROVIDER_SITE_OTHER): Payer: Commercial Managed Care - HMO | Admitting: Dermatology

## 2021-07-18 DIAGNOSIS — R21 Rash and other nonspecific skin eruption: Secondary | ICD-10-CM | POA: Diagnosis not present

## 2021-07-18 MED ORDER — IVERMECTIN 3 MG PO TABS: 150.0000 ug/kg | ORAL_TABLET | ORAL | 0 refills | Status: DC

## 2021-07-18 NOTE — Progress Notes (Signed)
Follow-Up Visit   Subjective  Cole Pierce is a 56 y.o. male who presents for the following: Rash (Here for suture removal. Discuss pathology results. Has been using Triamcinolone cream, HC cream and taking Hydroxyzine as directed. Patient reports improvement in some areas. Back, arms, lower abdomen still itching. Patient reports he has gotten some more bugs out of his skin since last visit, and bugs in urine ).   The following portions of the chart were reviewed this encounter and updated as appropriate:  Tobacco  Allergies  Meds  Problems  Med Hx  Surg Hx  Fam Hx      Review of Systems: No other skin or systemic complaints except as noted in HPI or Assessment and Plan.   Objective  Well appearing patient in no apparent distress; mood and affect are within normal limits.  A focused examination was performed including face, back, arms, hands, chest. Relevant physical exam findings are noted in the Assessment and Plan.  Right Upper Arm - Anterior Scattered excoriated lesions in various stages of healing   Assessment & Plan   Encounter for Removal of Sutures - Incision sites at the right upper back are clean, dry and intact - Wound cleansed, sutures removed, wound cleansed and steri strips applied.  - Discussed pathology results showing epidermal hyperplasia with excoriations  - Scars remodel for a full year. - Patient advised to call with any concerns or if they notice any new or changing lesions.  Rash  Related Medications hydrOXYzine (VISTARIL) 25 MG capsule Take 1 capsule (25 mg total) by mouth every 8 (eight) hours as needed for itching.  triamcinolone cream (KENALOG) 0.1 % Apply 1 Application topically 2 (two) times daily as needed. Avoid applying to face, groin, and axilla. Use as directed. Long-term use can cause thinning of the skin.  hydrocortisone 2.5 % cream Apply to itchy rash on the face, groin, and underarms BID up to two weeks.  ivermectin  (STROMECTOL) 3 MG TABS tablet Take 4 tablets (12,000 mcg total) by mouth as directed. Take 4 tablets by mouth today. In one week, take 4 more tablets by mouth.  Rash and other nonspecific skin eruption Right Upper Arm - Anterior  Ddx includes delusions of parasitosis > scabies +\- underlying itch/primary rash  Biopsy showed 1. Skin , right upper back inf EPIDERMAL HYPERPLASIA WITH FIBROSIS OVERLYING TATTOO, SEE DESCRIPTION 2. Skin , right upper back sup EPIDERMAL HYPERPLASIA WITH FIBROSIS, SEE DESCRIPTION Microscopic Description 1. There is epidermal hyperplasia with an effaced, focally disrupted rete ridge pattern overlying an area of fibrosis. There is no significant atypia. The changes are those of benign epidermal hyperplasia with superficial dermal fibrosis which are most likely the result of prior trauma or excoriation at this site. These changes are overlying another process. There is black granular material in the dermis. These features correlate with a tattoo. Following review of the hematoxylin and eosin sections, a PAS stain was obtained to exclude a fungal infection. The PAS stain is negative for fungal organisms. 2. There is epidermal hyperplasia with an effaced, focally disrupted rete ridge pattern overlying an area of fibrosis. There is no significant atypia. The changes are those of benign epidermal hyperplasia with superficial dermal fibrosis which are most likely the result of prior trauma or excoriation at this site. Following review of the hematoxylin and eosin sections, a PAS stain was obtained to exclude a fungal infection. The PAS stain is negative for fungal organisms.  Given new onset itch around 6  months ago, will treat empirically again for scabies to help ensure this is not a factor.   Take Ivermectin 3 mg as directed: take 4 tablets by mouth today, in one week take 4 more tablets by mouth   Continue Triamcinolone cream, Hydrocortisone cream and hydroxyzine  tablets as directed.   Topical steroids (such as triamcinolone, fluocinolone, fluocinonide, mometasone, clobetasol, halobetasol, betamethasone, hydrocortisone) can cause thinning and lightening of the skin if they are used for too long in the same area. Your physician has selected the right strength medicine for your problem and area affected on the body. Please use your medication only as directed by your physician to prevent side effects.   Will review medical history, medications and recent labs. May consider NAC and/or naloxone before referral if appropriate.    Return for Rash Follow Up 3-4 weeks.  I, Emelia Salisbury, CMA, am acting as scribe for Forest Gleason, MD.  Documentation: I have reviewed the above documentation for accuracy and completeness, and I agree with the above.  Forest Gleason, MD

## 2021-07-18 NOTE — Patient Instructions (Addendum)
Take Ivermectin 3 mg as directed: take 4 tablets by mouth today, in one week take 4 more tablets by mouth   Continue Triamcinolone cream, Hydrocortisone cream and hydroxyzine tablets as directed.  Gentle Skin Care Guide  1. Bathe no more than once a day.  2. Avoid bathing in hot water  3. Use a mild soap like Dove, Vanicream, Cetaphil, CeraVe. Can use Lever 2000 or Cetaphil antibacterial soap  4. Use soap only where you need it. On most days, use it under your arms, between your legs, and on your feet. Let the water rinse other areas unless visibly dirty.  5. When you get out of the bath/shower, use a towel to gently blot your skin dry, don't rub it.  6. While your skin is still a little damp, apply a moisturizing cream such as Vanicream, CeraVe, Cetaphil, Eucerin, Sarna lotion or plain Vaseline Jelly. For hands apply Neutrogena [place] Hand Cream or Excipial Hand Cream.  7. Reapply moisturizer any time you start to itch or feel dry.  8. Sometimes using free and clear laundry detergents can be helpful. Fabric softener sheets should be avoided. Downy Free & Gentle liquid, or any liquid fabric softener that is free of dyes and perfumes, it acceptable to use  9. If your doctor has given you prescription creams you may apply moisturizers over them      Due to recent changes in healthcare laws, you may see results of your pathology and/or laboratory studies on MyChart before the doctors have had a chance to review them. We understand that in some cases there may be results that are confusing or concerning to you. Please understand that not all results are received at the same time and often the doctors may need to interpret multiple results in order to provide you with the best plan of care or course of treatment. Therefore, we ask that you please give [place] 2 business days to thoroughly review all your results before contacting the office for clarification. Should we see a critical lab result,  you will be contacted sooner.   If You Need Anything After Your Visit  If you have any questions or concerns for your doctor, please call our main line at [phone] and press option 4 to reach your doctor's medical assistant. If no one answers, please leave a voicemail as directed and we will return your call as soon as possible. Messages left after 4 pm will be answered the following business day.   You may also send [place] a message via [facility]. We typically respond to MyChart messages within 1-2 business days.  For prescription refills, please ask your pharmacy to contact our office. Our fax number is [phone].  If you have an urgent issue when the clinic is closed that cannot wait until the next business day, you can page your doctor at the number below.    Please note that while we do our best to be available for urgent issues outside of office hours, we are not available 24/7.   If you have an urgent issue and are unable to reach [place], you may choose to seek medical care at your doctor's office, retail clinic, urgent care center, or emergency room.  If you have a medical emergency, please immediately call 911 or go to the emergency department.  Pager Numbers  - Dr. Nehemiah Massed: [phone]  - Dr. Laurence Ferrari: [phone]  - Dr. Nicole Kindred: [phone]  In the event of inclement weather, please call our main line at [phone]  for an update on the status of any delays or closures.  Dermatology Medication Tips: Please keep the boxes that topical medications come in in order to help keep track of the instructions about where and how to use these. Pharmacies typically print the medication instructions only on the boxes and not directly on the medication tubes.   If your medication is too expensive, please contact our office at [phone] option 4 or send [place] a message through [place].   We are unable to tell what your co-pay for medications will be in advance as this is different  depending on your insurance coverage. However, we may be able to find a substitute medication at lower cost or fill out paperwork to get insurance to cover a needed medication.   If a prior authorization is required to get your medication covered by your insurance company, please allow [place] 1-2 business days to complete this process.  Drug prices often vary depending on where the prescription is filled and some pharmacies may offer cheaper prices.  The website www.goodrx.com contains coupons for medications through different pharmacies. The prices here do not account for what the cost may be with help from insurance (it may be cheaper with your insurance), but the website can give you the price if you did not use any insurance.  - You can print the associated coupon and take it with your prescription to the pharmacy.  - You may also stop by our office during regular business hours and pick up a GoodRx coupon card.  - If you need your prescription sent electronically to a different pharmacy, notify our office through [facility] or by phone at [phone] option 4.     Si Usted Necesita Algo Despus de Su Visita  Tambin puede enviarnos un mensaje a travs de Pharmacist, community. Por lo general respondemos a los mensajes de MyChart en el transcurso de 1 a 2 das hbiles.  Para renovar recetas, por favor pida a su farmacia que se ponga en contacto con nuestra oficina. Harland Dingwall de fax es [place] [phone].  Si tiene un asunto urgente cuando la clnica est cerrada y que no puede esperar hasta el siguiente da hbil, puede llamar/localizar a su doctor(a) al nmero que aparece a continuacin.   Por favor, tenga en cuenta que aunque hacemos todo lo posible para estar disponibles para asuntos urgentes fuera del horario de [place], no estamos disponibles las 24 horas del da, los 7 das de la [place].   Si tiene un problema urgente y no puede comunicarse con nosotros, puede optar por buscar atencin  mdica  en el consultorio de su doctor(a), en una clnica privada, en un centro de atencin urgente o en una sala de emergencias.  Si tiene Engineering geologist, por favor llame inmediatamente al 911 o vaya a la sala de emergencias.  Nmeros de bper  - Dr. Nehemiah Massed: [phone]  - Dra. Moye: [phone]  - Dra. Nicole Kindred: [phone]  En caso de inclemencias del [place], por favor llame a Johnsie Kindred principal al [phone] para una actualizacin sobre el [place] de cualquier retraso o cierre.  Consejos para la medicacin en dermatologa: Por favor, guarde las cajas en las que vienen los medicamentos de uso tpico para ayudarle a seguir las instrucciones sobre dnde y cmo usarlos. Las farmacias generalmente imprimen las instrucciones del medicamento slo en las cajas y no directamente en los tubos del [place].   Si su medicamento es [employer], por favor, pngase en contacto con Cleotis Nipper  oficina llamando al [phone] y presione la opcin 4 o envenos un mensaje a travs de Pharmacist, community.   No podemos decirle cul ser su copago por los medicamentos por adelantado ya que esto es diferente dependiendo de la cobertura de su seguro. Sin embargo, es posible que podamos encontrar un medicamento sustituto a Electrical engineer un formulario para que el seguro cubra el medicamento que se considera necesario.   Si se requiere una autorizacin previa para que su compaa de seguros [place] su medicamento, por favor permtanos de 1 a 2 das hbiles para completar este proceso.  Los precios de los medicamentos varan con frecuencia dependiendo del Environmental consultant de dnde se surte la receta y alguna farmacias pueden ofrecer precios ms baratos.  El sitio web www.goodrx.com tiene cupones para medicamentos de Airline pilot. Los precios aqu no tienen en cuenta lo que podra costar con la ayuda del seguro (puede ser ms barato con su seguro), pero el sitio web puede darle el precio si no utiliz Conservation officer, historic buildings.  - Puede imprimir el cupn correspondiente y llevarlo con su receta a la farmacia.  - Tambin puede pasar por nuestra oficina durante el horario de atencin regular y Charity fundraiser una tarjeta de cupones de GoodRx.  - Si necesita que su receta se enve electrnicamente a una farmacia diferente, informe a nuestra oficina a travs de MyChart de [place] o por telfono llamando al [phone] y presione la opcin 4.

## 2021-07-23 ENCOUNTER — Encounter: Payer: Self-pay | Admitting: Dermatology

## 2021-08-08 ENCOUNTER — Ambulatory Visit: Payer: Commercial Managed Care - HMO | Admitting: Surgery

## 2021-08-15 ENCOUNTER — Other Ambulatory Visit: Payer: Self-pay | Admitting: Nurse Practitioner

## 2021-08-15 ENCOUNTER — Ambulatory Visit: Payer: Commercial Managed Care - HMO | Admitting: Dermatology

## 2021-08-15 DIAGNOSIS — E1165 Type 2 diabetes mellitus with hyperglycemia: Secondary | ICD-10-CM

## 2021-08-15 MED ORDER — DAPAGLIFLOZIN PROPANEDIOL 5 MG PO TABS: 5.0000 mg | ORAL_TABLET | Freq: Every day | ORAL | 0 refills | Status: DC

## 2021-08-15 NOTE — Telephone Encounter (Signed)
Pt requested change of pharmacy to mail order  Requested Prescriptions  Pending Prescriptions Disp Refills  . dapagliflozin propanediol (FARXIGA) 5 MG TABS tablet 90 tablet 0    Sig: Take 1 tablet (5 mg total) by mouth daily before breakfast.     Endocrinology:  Diabetes - SGLT2 Inhibitors Failed - 08/15/2021 12:04 PM      Failed - HBA1C is between 0 and 7.9 and within 180 days    Hgb A1c MFr Bld  Date Value Ref Range Status  07/03/2021 8.6 (H) <5.7 % of total Hgb Final    Comment:    For someone without known diabetes, a hemoglobin A1c value of 6.5% or greater indicates that they may have  diabetes and this should be confirmed with a follow-up  test. . For someone with known diabetes, a value <7% indicates  that their diabetes is well controlled and a value  greater than or equal to 7% indicates suboptimal  control. A1c targets should be individualized based on  duration of diabetes, age, comorbid conditions, and  other considerations. . Currently, no consensus exists regarding use of hemoglobin A1c for diagnosis of diabetes for children. .          Passed - Cr in normal range and within 360 days    Creat  Date Value Ref Range Status  07/03/2021 0.79 0.70 - 1.30 mg/dL Final   Creatinine, Urine  Date Value Ref Range Status  07/03/2021 229 20 - 320 mg/dL Final         Passed - eGFR in normal range and within 360 days    GFR, Estimated  Date Value Ref Range Status  01/18/2021 >60 >60 mL/min Final    Comment:    (NOTE) Calculated using the CKD-EPI Creatinine Equation (2021)    eGFR  Date Value Ref Range Status  07/03/2021 104 > OR = 60 mL/min/1.75m Final    Comment:    The eGFR is based on the CKD-EPI 2021 equation. To calculate  the new eGFR from a previous Creatinine or Cystatin C result, go to https://www.kidney.org/professionals/ kdoqi/gfr%5Fcalculator          Passed - Valid encounter within last 6 months    Recent Outpatient Visits          1 month  ago Type 2 diabetes mellitus with hyperglycemia, without long-term current use of insulin [facility]   [facility]   [facility]      Future Appointments            In 2 months [facility]

## 2021-08-15 NOTE — Telephone Encounter (Signed)
Medication Refill - Medication:dapagliflozin propanediol (FARXIGA) 5 MG TABS tablet 90 day supply / up to 3 refills if possible  Has the patient contacted their pharmacy? Yes.   ( (Agent: If yes, when and what did the pharmacy advise?) Pharmacy is calling requesting scripting sent over on 08/05/2021  Preferred Pharmacy (with phone number or street name):   [place]  Has the patient been seen for an appointment in the last year OR does the patient have an upcoming appointment? Yes.    Agent: Please be advised that RX refills may take up to 3 business days. We ask that you follow-up with your pharmacy.

## 2021-08-21 ENCOUNTER — Other Ambulatory Visit: Payer: Self-pay

## 2021-08-21 DIAGNOSIS — R21 Rash and other nonspecific skin eruption: Secondary | ICD-10-CM

## 2021-08-21 MED ORDER — TRIAMCINOLONE ACETONIDE 0.1 % EX CREA: 1.0000 | TOPICAL_CREAM | Freq: Two times a day (BID) | CUTANEOUS | 0 refills | Status: DC | PRN

## 2021-08-21 NOTE — Progress Notes (Signed)
Refill request faxed over from [place] requesting a 90 day supply of Triamcinolone. Due to patient missing last apt. 1 Rx sent in with 0 refills as patient will need apt for more refills.

## 2021-08-22 ENCOUNTER — Ambulatory Visit: Payer: Commercial Managed Care - HMO | Admitting: Surgery

## 2021-08-22 ENCOUNTER — Encounter: Payer: Self-pay | Admitting: Surgery

## 2021-08-22 VITALS — BP 129/61 | HR 84 | Temp 98.6°F | Ht 68.0 in | Wt 178.0 lb

## 2021-08-22 DIAGNOSIS — K429 Umbilical hernia without obstruction or gangrene: Secondary | ICD-10-CM | POA: Diagnosis not present

## 2021-08-22 DIAGNOSIS — K7031 Alcoholic cirrhosis of liver with ascites: Secondary | ICD-10-CM

## 2021-08-22 DIAGNOSIS — K4090 Unilateral inguinal hernia, without obstruction or gangrene, not specified as recurrent: Secondary | ICD-10-CM | POA: Insufficient documentation

## 2021-08-22 DIAGNOSIS — K8020 Calculus of gallbladder without cholecystitis without obstruction: Secondary | ICD-10-CM | POA: Diagnosis not present

## 2021-08-22 DIAGNOSIS — N2889 Other specified disorders of kidney and ureter: Secondary | ICD-10-CM

## 2021-08-22 NOTE — Patient Instructions (Signed)
If you have any concerns or questions, please feel free to call our office.   Inguinal Hernia, Adult An inguinal hernia is when fat or your intestines push through a weak spot in a muscle where your leg meets your lower belly (groin). This causes a bulge. This kind of hernia could also be: In your scrotum, if you are male. In folds of skin around your vagina, if you are male. There are three types of inguinal hernias: Hernias that can be pushed back into the belly (are reducible). This type rarely causes pain. Hernias that cannot be pushed back into the belly (are incarcerated). Hernias that cannot be pushed back into the belly and lose their blood supply (are strangulated). This type needs emergency surgery. What are the causes? This condition is caused by having a weak spot in the muscles or tissues in your groin. This develops over time. The hernia may poke through the weak spot when you strain your lower belly muscles all of a sudden, such as when you: Lift a heavy object. Strain to poop (have a bowel movement). Trouble pooping (constipation) can lead to straining. Cough. What increases the risk? This condition is more likely to develop in: Males. Pregnant females. People who: Are overweight. Work in jobs that require long periods of standing or heavy lifting. Have had an inguinal hernia before. Smoke or have lung disease. These factors can lead to long-term (chronic) coughing. What are the signs or symptoms? Symptoms may depend on the size of the hernia. Often, a small hernia has no symptoms. Symptoms of a larger hernia may include: A bulge in the groin area. This is easier to see when standing. You might not be able to see it when you are lying down. Pain or burning in the groin. This may get worse when you lift, strain, or cough. A dull ache or a feeling of pressure in the groin. An abnormal bulge in the scrotum, in males. Symptoms of a strangulated inguinal hernia may  include: A bulge in your groin that is very painful and tender to the touch. A bulge that turns red or purple. Fever, feeling like you may vomit (nausea), and vomiting. Not being able to poop or to pass gas. How is this treated? Treatment depends on the size of your hernia and whether you have symptoms. If you do not have symptoms, your doctor may have you watch your hernia carefully and have you come in for follow-up visits. If your hernia is large or if you have symptoms, you may need surgery to repair the hernia. Follow these instructions at home: Lifestyle Avoid lifting heavy objects. Avoid standing for long amounts of time. Do not smoke or use any products that contain nicotine or tobacco. If you need help quitting, ask your doctor. Stay at a healthy weight. Prevent trouble pooping You may need to take these actions to prevent or treat trouble pooping: Drink enough fluid to keep your pee (urine) pale yellow. Take over-the-counter or prescription medicines. Eat foods that are high in fiber. These include beans, whole grains, and fresh fruits and vegetables. Limit foods that are high in fat and sugar. These include fried or sweet foods. General instructions You may try to push your hernia back in place by very gently pressing on it when you are lying down. Do not try to push the bulge back in if it will not go in easily. Watch your hernia for any changes in shape, size, or color. Tell your doctor if   you see any changes. Take over-the-counter and prescription medicines only as told by your doctor. Keep all follow-up visits. Contact a doctor if: You have a fever or chills. You have new symptoms. Your symptoms get worse. Get help right away if: You have pain in your groin that gets worse all of a sudden. You have a bulge in your groin that: Gets bigger all of a sudden, and it does not get smaller after that. Turns red or purple. Is painful when you touch it. You are a male, and you  have: Sudden pain in your scrotum. A sudden change in the size of your scrotum. You cannot push the hernia back in place by very gently pressing on it when you are lying down. You feel like you may vomit, and that feeling does not go away. You keep vomiting. You have a fast heartbeat. You cannot poop or pass gas. These symptoms may be an emergency. Get help right away. Call your local emergency services (911 in the [place] Do not wait to see if the symptoms will go away. Do not drive yourself to the hospital. Summary An inguinal hernia is when fat or your intestines push through a weak spot in a muscle where your leg meets your lower belly (groin). This causes a bulge. If you do not have symptoms, you may not need treatment. If you have symptoms or a large hernia, you may need surgery. Avoid lifting heavy objects. Also, avoid standing for long amounts of time. Do not try to push the bulge back in if it will not go in easily. This information is not intended to replace advice given to you by your health care provider. Make sure you discuss any questions you have with your health care provider. Document Revised: 08/18/2019 Document Reviewed: 08/18/2019 Elsevier Patient Education  2023 [employer] Inc[employer]  

## 2021-08-22 NOTE — Progress Notes (Signed)
Patient ID: Cole Pierce, male   DOB: 1965-06-23, 56 y.o.   MRN: [id number]  Chief Complaint: Right inguinal hernia and umbilical hernias  History of Present Illness Cole Pierce is a 56 y.o. male with a recollection of our last visit earlier this year.  He seems to struggle remembering names of doctors, but reports that someone at [facility] had said that they were closing the practice and could not see him.  I do not see a record Verne Spurr, MD (Attending) NPI: [id number] [phone] (Work) [phone] (Fax) [id number] [place], [place] [address] Urology Mar. 06, 2023March 06, 2023  Associated with the notation above in epic, I am not certain what came of this referral. He presents since today hoping to proceed with his hernia repairs, continuing to have significant ascites and currently not under any medical management with diuretics for the same.  Past Medical History Past Medical History:  Diagnosis Date   Arthritis    Ascites    Cancer ([place])    Cirrhosis ([place])    Diabetes mellitus without complication (HCC)    Hernia, hiatal    IDA (iron deficiency anemia)    Inguinal hernia    right   Portal venous hypertension (HCC)    Renal cell carcinoma ([place])    Splenomegaly    Thrombocytopenia ([place])       Past Surgical History:  Procedure Laterality Date   COLONOSCOPY N/A 06/19/2021   Procedure: COLONOSCOPY;  Surgeon: Lesly Rubenstein, MD;  Location: [facility] ENDOSCOPY;  Service: Endoscopy;  Laterality: N/A;   ESOPHAGOGASTRODUODENOSCOPY N/A 06/19/2021   Procedure: ESOPHAGOGASTRODUODENOSCOPY (EGD);  Surgeon: Lesly Rubenstein, MD;  Location: [facility] ENDOSCOPY;  Service: Endoscopy;  Laterality: N/A;   FRACTURE SURGERY      Allergies  Allergen Reactions   Zantac [Ranitidine Hcl] Anaphylaxis   Penicillins Hives    Hives in mouth    Current Outpatient Medications  Medication Sig Dispense Refill   Blood Glucose Monitoring Suppl w/Device  KIT 1 each by Does not apply route daily. 1 kit 0   dapagliflozin propanediol (FARXIGA) 5 MG TABS tablet Take 1 tablet (5 mg total) by mouth daily before breakfast. 90 tablet 0   glipiZIDE (GLUCOTROL) 5 MG tablet Take 1 tablet (5 mg total) by mouth 2 (two) times daily before a meal. 60 tablet 3   Glucose Blood (BLOOD GLUCOSE TEST STRIPS) STRP Use as directed to monitor FSBS once daily for DM 100 strip 3   hydrocortisone 2.5 % cream Apply to itchy rash on the face, groin, and underarms BID up to two weeks. 28 g 0   hydrOXYzine (VISTARIL) 25 MG capsule Take 1 capsule (25 mg total) by mouth every 8 (eight) hours as needed for itching. 30 capsule 0   ivermectin (STROMECTOL) 3 MG TABS tablet Take 4 tablets (12,000 mcg total) by mouth as directed. Take 4 tablets by mouth today. In one week, take 4 more tablets by mouth. 8 tablet 0   permethrin (ELIMITE) 5 % cream Apply to entire body other than face - let sit for 14 hours then wash off, may repeat in 1 week if still having symptoms 60 g 1   triamcinolone cream (KENALOG) 0.1 % Apply 1 Application topically 2 (two) times daily as needed. Avoid applying to face, groin, and axilla. Use as directed. Long-term use can cause thinning of the skin. 453.6 g 0   No current facility-administered medications for this visit.    Family History  History reviewed. No pertinent family history.    Social History Social History   Tobacco Use   Smoking status: Some Days    Types: Cigars   Smokeless tobacco: Never  Vaping Use   Vaping Use: Never used  Substance Use Topics   Alcohol use: Not Currently    Comment: had quit for 13 years and recently started back   Drug use: Never        Review of Systems  Constitutional: Negative.   HENT: Negative.    Eyes: Negative.   Respiratory: Negative.    Cardiovascular: Negative.   Gastrointestinal: Negative.   Genitourinary: Negative.   Skin: Negative.   Neurological: Negative.   Psychiatric/Behavioral: Negative.         Physical Exam Blood pressure 129/61, pulse 84, temperature 98.6 F (37 C), temperature source Oral, height _0  (1.727 m), weight 178 lb (80.7 kg), SpO2 97 %. Last Weight  Most recent update: 08/22/2021  2:52 PM    Weight  80.7 kg (178 lb)             CONSTITUTIONAL: Well developed, and nourished, appropriately responsive and aware without distress.   EYES: Sclera non-icteric.   EARS, NOSE, MOUTH AND THROAT:  The oropharynx is clear. Oral mucosa is pink and moist.    Hearing is intact to voice.  NECK: Trachea is midline, and there is no jugular venous distension.  LYMPH NODES:  Lymph nodes in the neck are not enlarged. RESPIRATORY:  Normal respiratory effort without pathologic use of accessory muscles. CARDIOVASCULAR: Heart is regular in rate and rhythm. GI: The abdomen is  soft, nontender, and nondistended.  There is a readily reducible umbilical hernia that is slightly bluish discoloration consistent with ascitic fluid.  There were no palpable masses. I did not appreciate hepatosplenomegaly. There were normal bowel sounds. GU: Readily reducible fluid-filled right inguinal hernia without appreciable bowel involvement. MUSCULOSKELETAL:  Symmetrical muscle tone appreciated in all four extremities.    SKIN: Skin turgor is normal. No pathologic skin lesions appreciated.  NEUROLOGIC:  Motor and sensation appear grossly normal.  Cranial nerves are grossly without defect. PSYCH:  Alert and oriented to person, place and time. Affect is appropriate for situation.  Data Reviewed I have personally reviewed what is currently available of the patient's imaging, recent labs and medical records.   Labs:     Latest Ref Rng & Units 07/03/2021   10:05 AM 01/18/2021   12:22 PM 11/11/2007    3:27 AM  CBC  WBC 3.8 - 10.8 Thousand/uL 3.5  4.8    Hemoglobin 13.2 - 17.1 g/dL 8.8  9.0  15.6   Hematocrit 38.5 - 50.0 % 30.3  31.5  46.0   Platelets 140 - 400 Thousand/uL 112  108        Latest  Ref Rng & Units 07/03/2021   10:05 AM 02/21/2021   12:22 PM 01/18/2021   12:22 PM  CMP  Glucose 65 - 99 mg/dL 205   370   BUN 7 - 25 mg/dL 14   14   Creatinine 0.70 - 1.30 mg/dL 0.79  0.60  0.68   Sodium 135 - 146 mmol/L 140   132   Potassium 3.5 - 5.3 mmol/L 3.9   3.9   Chloride 98 - 110 mmol/L 108   104   CO2 20 - 32 mmol/L 25   24   Calcium 8.6 - 10.3 mg/dL 8.6   8.0   Total Protein 6.1 - 8.1 g/dL  7.0   7.3   Total Bilirubin 0.2 - 1.2 mg/dL 1.5   1.0   Alkaline Phos 38 - 126 U/L   92   AST 10 - 35 U/L 23   18   ALT 9 - 46 U/L 14   13       Imaging: Reviewed CT from earlier this year.  CLINICAL DATA:  Further evaluation of right renal lesion seen on prior CT.   EXAM: CT ABDOMEN WITHOUT AND WITH CONTRAST   TECHNIQUE: Multidetector CT imaging of the abdomen was performed following the standard protocol before and following the bolus administration of intravenous contrast.   RADIATION DOSE REDUCTION: This exam was performed according to the departmental dose-optimization program which includes automated exposure control, adjustment of the mA and/or kV according to patient size and/or use of iterative reconstruction technique.   CONTRAST:  170m OMNIPAQUE IOHEXOL 300 MG/ML  SOLN   COMPARISON:  CT January 18, 2021   FINDINGS: Lower chest: No acute abnormality.   Hepatobiliary: Cirrhotic hepatic morphology. No arterially enhancing hepatic lesion. Cholelithiasis in a distended gallbladder. No biliary ductal dilation.   Pancreas: No pancreatic ductal dilation or evidence of acute inflammation.   Spleen: Splenomegaly measuring 17.7 cm in maximum craniocaudal dimension.   Adrenals/Urinary Tract: Bilateral adrenal glands are within normal limits.   No hydronephrosis.   Enhancing heterogeneous 5.4 x 4.3 x 4.7 cm right lower pole renal lesion extends and effaces both Gerota's fascia and the right psoas muscle however there appears to be preservation of fat  planes between both structures likely reflecting abutment without invasion.   Left kidney is unremarkable without hydronephrosis or solid enhancing renal mass.   Stomach/Bowel: No enteric contrast was administered. Small hiatal hernia otherwise stomach is unremarkable for degree of distension. No pathologic dilation of small or large bowel in the abdomen.   Vascular/Lymphatic: Normal caliber abdominal aorta. The portal, splenic and superior mesenteric veins appear patent. Extensive portosystemic collateral vessels. Right renal vein is patent without evidence of tumor in vein.   Mildly enlarged/prominent periportal lymph nodes measuring up to 11 mm in short axis on image 54/6. No pathologically enlarged periaortic lymph nodes.   Other: Large volume abdominal ascites. Small fluid containing ventral hernia.   Musculoskeletal: No aggressive lytic or blastic lesion of bone.   IMPRESSION: 1. Enhancing heterogeneous 5.4 cm right lower pole renal lesion, consistent with renal cell carcinoma. 2. No definite evidence of tumor in vein or metastatic disease in the abdomen. 3. Cirrhosis with sequelae of portal hypertension including splenomegaly and large volume abdominal ascites. 4. Mildly enlarged/prominent periportal lymph nodes, likely reactive in the setting of cirrhosis. Attention on follow-up imaging suggested. 5. Cholelithiasis without evidence of acute cholecystitis.   * onc *     Electronically Signed   By: JDahlia BailiffM.D.   On: 02/23/2021 13:36  Reviewed chart extensively hoping to find evidence of further follow-up.  Within last 24 hrs: No results found.  Assessment    Right lower pole renal lesion consistent with renal cell carcinoma. Cirrhosis with portal hypertension sequelae, and large volume abdominal ascites. Umbilical and right inguinal hernias. Cholelithiasis Patient Active Problem List   Diagnosis Date Noted   Right inguinal hernia 08/22/2021    Calculus of gallbladder without cholecystitis without obstruction 08/22/2021   Type 2 diabetes mellitus without complication, without long-term current use of insulin ([place] 01/19/2021   Cir[place]osis of [place] with [place]ites [place] 01/19/2021   Abdominal fluid collection 01/19/2021   Iron deficiency  anemia 07/07/2020   Thrombocytopenia ([place]) 84/78/4128   Umbilical hernia 20/81/3887   Seropositive rheumatoid arthritis ([place]) 03/25/2020    Plan    We will reattempt referral to [facility] urology for work-up/treatment of likely right renal cell carcinoma. Both of these hernias present very little threat to this gentleman at present though I am sure the right inguinal is uncomfortable due to the fluid that has accumulated in this space.  Both are readily reducible and remain elective, elective surgery should be deferred for potential hernia repair until control of ascites is obtained.  Face-to-face time spent with the patient and accompanying care providers(if present) was 50 minutes, with more than 50% of the time spent counseling, educating, and coordinating care of the patient.    These notes generated with voice recognition software. I apologize for typographical errors.  Ronny Bacon M.D., FACS 08/22/2021, 3:39 PM

## 2021-08-25 NOTE — Progress Notes (Deleted)
Office Visit Note  Patient: Cole Pierce             Date of Birth: 10-Dec-1965           MRN: [id number]             PCP: Bo Merino, FNP Referring: Bo Merino, FNP Visit Date: 09/07/2021 Occupation: '@GUAROCC'$ @  Subjective:  No chief complaint on file.   History of Present Illness: Cole Pierce is a 56 y.o. male ***   Activities of Daily Living:  Patient reports morning stiffness for *** {minute/hour:19697}.   Patient {ACTIONS;DENIES/REPORTS:21021675::"Denies"} nocturnal pain.  Difficulty dressing/grooming: {ACTIONS;DENIES/REPORTS:21021675::"Denies"} Difficulty climbing stairs: {ACTIONS;DENIES/REPORTS:21021675::"Denies"} Difficulty getting out of chair: {ACTIONS;DENIES/REPORTS:21021675::"Denies"} Difficulty using hands for taps, buttons, cutlery, and/or writing: {ACTIONS;DENIES/REPORTS:21021675::"Denies"}  No Rheumatology ROS completed.   PMFS History:  Patient Active Problem List   Diagnosis Date Noted   Right inguinal hernia 08/22/2021   Calculus of gallbladder without cholecystitis without obstruction 08/22/2021   Type 2 diabetes mellitus without complication, without long-term current use of insulin ([place]) 01/19/2021   Cirrhosis of liver with ascites ([place]) 01/19/2021   Abdominal fluid collection 01/19/2021   Iron deficiency anemia 07/07/2020   Thrombocytopenia ([place]) 58/52/7782   Umbilical hernia 42/35/3614   Seropositive rheumatoid arthritis ([place]) 03/25/2020    Past Medical History:  Diagnosis Date   Arthritis    Ascites    Cancer ([place])    Cirrhosis ([place])    Diabetes mellitus without complication (HCC)    Hernia, hiatal    IDA (iron deficiency anemia)    Inguinal hernia    right   Portal venous hypertension (HCC)    Renal cell carcinoma (HCC)    Splenomegaly    Thrombocytopenia ([place])     No family history on file. Past Surgical History:  Procedure Laterality Date   COLONOSCOPY N/A 06/19/2021   Procedure: COLONOSCOPY;  Surgeon: Lesly Rubenstein, MD;  Location: [facility] ENDOSCOPY;  Service: Endoscopy;  Laterality: N/A;   ESOPHAGOGASTRODUODENOSCOPY N/A 06/19/2021   Procedure: ESOPHAGOGASTRODUODENOSCOPY (EGD);  Surgeon: Lesly Rubenstein, MD;  Location[facility] ENDOSCOPY;  Service: Endoscopy;  Laterality: N/A;   FRACTURE SURGERY     Social History   Social History Narrative   ** Merged History Encounter **       Immunization History  Administered Date(s) Administered   DTaP 05/11/1970, 07/14/1970, 09/14/1970   Hepb-cpg 07/03/2021   IPV 05/11/1970, 07/14/1970, 09/14/1970   Influenza,inj,Quad PF,6+ Mos 01/06/2019   Measles 12/14/1970   Moderna Sars-Covid-2 Vaccination 03/24/2019, 04/21/2019   Pneumococcal Conjugate-13 11/22/2017, 02/03/2019   Pneumococcal Polysaccharide-23 01/17/2018   Rubella 12/14/1970, 09/26/1977     Objective: Vital Signs: There were no vitals taken for this visit.   Physical Exam   Musculoskeletal Exam: ***  CDAI Exam: CDAI Score: -- Patient Global: --; Provider Global: -- Swollen: --; Tender: -- Joint Exam 09/07/2021   No joint exam has been documented for this visit   There is currently no information documented on the homunculus. Go to the Rheumatology activity and complete the homunculus joint exam.  Investigation: No additional findings.  Imaging: No results found.  Recent Labs: Lab Results  Component Value Date   WBC 3.5 (L) 07/03/2021   HGB 8.8 (L) 07/03/2021   PLT 112 (L) 07/03/2021   NA 140 07/03/2021   K 3.9 07/03/2021   CL 108 07/03/2021   CO2 25 07/03/2021   GLUCOSE 205 (H) 07/03/2021   BUN 14 07/03/2021   CREATININE 0.79 07/03/2021   BILITOT 1.5 (H)  07/03/2021   ALKPHOS 92 01/18/2021   AST 23 07/03/2021   ALT 14 07/03/2021   PROT 7.0 07/03/2021   ALBUMIN 2.8 (L) 01/18/2021   CALCIUM 8.6 07/03/2021    Speciality Comments: No specialty comments available.  Procedures:  No procedures performed Allergies: Zantac [ranitidine hcl] and Penicillins    Assessment / Plan:     Visit Diagnoses: Seropositive rheumatoid arthritis ([place])  Cirrhosis of liver with ascites, unspecified hepatic cirrhosis type ([place])  Calculus of gallbladder without cholecystitis without obstruction  Type 2 diabetes mellitus without complication, without long-term current use of insulin (HCC)  Thrombocytopenia (HCC)  Umbilical hernia without obstruction and without gangrene  Right inguinal hernia  History of iron deficiency anemia  Orders: No orders of the defined types were placed in this encounter.  No orders of the defined types were placed in this encounter.   Face-to-face time spent with patient was *** minutes. Greater than 50% of time was spent in counseling and coordination of care.  Follow-Up Instructions: No follow-ups on file.   Ofilia Neas, PA-C  Note - This record has been created using Dragon software.  Chart creation errors have been sought, but may not always  have been located. Such creation errors do not reflect on  the standard of medical care.

## 2021-09-05 ENCOUNTER — Ambulatory Visit: Payer: Commercial Managed Care - HMO

## 2021-09-05 ENCOUNTER — Telehealth: Payer: Self-pay | Admitting: Nurse Practitioner

## 2021-09-05 NOTE — Telephone Encounter (Signed)
Patient called in to reschedule appt for today. Call disconnected due to inability to hear patient during the call. Attempted to return call to patient, but no answer. Left voicemail message. If patient returns call please reschedule nurse visit appt for today.

## 2021-09-07 ENCOUNTER — Ambulatory Visit: Payer: Commercial Managed Care - HMO | Attending: Rheumatology | Admitting: Rheumatology

## 2021-09-07 DIAGNOSIS — D696 Thrombocytopenia, unspecified: Secondary | ICD-10-CM

## 2021-09-07 DIAGNOSIS — K8020 Calculus of gallbladder without cholecystitis without obstruction: Secondary | ICD-10-CM

## 2021-09-07 DIAGNOSIS — K429 Umbilical hernia without obstruction or gangrene: Secondary | ICD-10-CM

## 2021-09-07 DIAGNOSIS — E119 Type 2 diabetes mellitus without complications: Secondary | ICD-10-CM

## 2021-09-07 DIAGNOSIS — R188 Other ascites: Secondary | ICD-10-CM

## 2021-09-07 DIAGNOSIS — M059 Rheumatoid arthritis with rheumatoid factor, unspecified: Secondary | ICD-10-CM

## 2021-09-07 DIAGNOSIS — K4090 Unilateral inguinal hernia, without obstruction or gangrene, not specified as recurrent: Secondary | ICD-10-CM

## 2021-09-07 DIAGNOSIS — Z862 Personal history of diseases of the blood and blood-forming organs and certain disorders involving the immune mechanism: Secondary | ICD-10-CM

## 2021-09-08 ENCOUNTER — Ambulatory Visit (INDEPENDENT_AMBULATORY_CARE_PROVIDER_SITE_OTHER): Payer: Commercial Managed Care - HMO

## 2021-09-08 DIAGNOSIS — Z23 Encounter for immunization: Secondary | ICD-10-CM | POA: Diagnosis not present

## 2021-09-08 NOTE — Telephone Encounter (Signed)
Pt called 2:25 today and states he can be there in about an hour for nurse visit.

## 2021-10-05 ENCOUNTER — Ambulatory Visit: Payer: Commercial Managed Care - HMO

## 2021-10-06 ENCOUNTER — Ambulatory Visit: Payer: Commercial Managed Care - HMO

## 2021-11-03 ENCOUNTER — Ambulatory Visit: Payer: Commercial Managed Care - HMO | Admitting: Nurse Practitioner

## 2021-11-03 NOTE — Progress Notes (Deleted)
 There were no vitals taken for this visit.   Subjective:    Patient ID: Cole Pierce, male    DOB: 03/04/1965, 56 y.o.   MRN: [id number]  HPI: Cole Pierce is a 56 y.o. male  No chief complaint on file.  Diabetes: His last A1C was 8.6 on 07/03/2021 which is down for 10.2.  Patient is currently taking glipizide 5 mg daily and farxiga 5 mg daily.  He denies any polyuria, polydipsia or polyphagia.  He was previously on metformin but was unable to tolerate it caused abdominal pain  Thrombocytopenia/ iron deficiency anemia: Last office visit referral placed for hematology.  Patient reports he had a lot going on with unable to schedule an appointment.  Referral has been closed. Hx of thrombocytopenia and iron deficiency anemia and unable to tolerate iron supplements.   Rheumatoid arthritis: Taking methotrexate.  He was seeing a rheumatologist in [place] but a referral was placed for 1 close to here.  He has cirrhosis of the liver.  Patient did stop taking methotrexate due to this.  Patient has not been to see rheumatologist yet.  Patient had an appointment scheduled on 09/07/2021 and a no-show at that appointment.  Referral has been closed.  Cirrhosis of liver: Patients GI is Dr. Croley.  Patient states he had a CT done on January 18, 2021 which showed hepatic cirrhosis, marked splenomegaly with small upper abdominal varices,cholelithiasis.  Supposed to have an appointment on 10/02/2021, patient did not go. He says he is supposed to be getting an EGD and colonoscopy.   Patient says he used to drink before being in prison but has stopped and has recently restarted again.  Discussed that he needs to avoid alcohol stopped methotrexate because of it is hepatic toxic.  Inguinal hernia: Patient reports he had a right inguinal hernia for several years.  Patient states his has become troublesome.  Referral placed for surgery at last visit.  Patient saw surgery Dr. Rodenberg and he documented that Both of  these hernias present very little threat to this gentleman at present though I am sure the right inguinal is uncomfortable due to the fluid that has accumulated in this space.  Both are readily reducible and remain elective, elective surgery should be deferred for potential hernia repair until control of ascites is obtained.      07/03/2021    9:37 AM 06/20/2021    1:31 PM 04/03/2021    9:32 AM  Depression screen PHQ 2/9  Decreased Interest 0 0 0  Down, Depressed, Hopeless 0 0 0  PHQ - 2 Score 0 0 0  Altered sleeping 0 0   Tired, decreased energy 0 0   Change in appetite 0 0   Feeling bad or failure about yourself  0 0   Trouble concentrating 0 0   Moving slowly or fidgety/restless 0 0   Suicidal thoughts 0 0   PHQ-9 Score 0 0   Difficult doing work/chores Not difficult at all Not difficult at all     Right renal mass: He saw Dr. Sninsky on 02/23/2021 for right renal mass.  Ct showed Enhancing heterogeneous 5.4 x 4.3 x 4.7 cm right lower pole renal lesion extends and effaces both Gerota's fascia and the right psoas muscle however there appears to be preservation of fat planes between both structures likely reflecting abutment without invasion. He was was told that surgery is the treatment however he is a high risk surgical candidate. He was referred to a tertiary   care center. Patient then saw Dr. Rodenberg for his hernia and was told he needed to see urology specialist at [facility].   Patient has not scheduled an appointment. According to referral notes they have reached out several times to patient with no success.   Relevant past medical, surgical, family and social history reviewed and updated as indicated. Interim medical history since our last visit reviewed. Allergies and medications reviewed and updated.   Review of Systems Constitutional: Negative for fever or weight change.  Respiratory: Negative for cough and shortness of breath.   Cardiovascular: Negative for chest pain or palpitations.   Gastrointestinal: Negative for abdominal pain, no bowel changes.  Positive for right inguinal hernia Musculoskeletal: Negative for gait problem or joint swelling.  Skin: positive for rash.  Neurological: Negative for dizziness or headache.  No other specific complaints in a complete review of systems (except as listed in HPI above).      Objective:    There were no vitals taken for this visit.  Wt Readings from Last 3 Encounters:  08/22/21 178 lb (80.7 kg)  07/03/21 166 lb 11.2 oz (75.6 kg)  04/03/21 174 lb (78.9 kg)    Physical Exam  Constitutional: Patient appears well-developed and well-nourished.  No distress.  HEENT: head atraumatic, normocephalic, pupils equal and reactive to light,  neck supple Cardiovascular: Normal rate, regular rhythm and normal heart sounds.  No murmur heard. No BLE edema. Pulmonary/Chest: Effort normal and breath sounds normal. No respiratory distress. Abdominal: Soft.  There is no tenderness. Psychiatric: Patient has a normal mood and affect. behavior is normal. Judgment and thought content normal.  Results for orders placed or performed in visit on 07/03/21  Arthropod Identification   Specimen: Sterile container; Example of Arthropod  Result Value Ref Range   MICRO NUMBER [id number]    SPECIMEN QUALITY Adequate    Source of arthropod CHEST,BACK,ARMS    STATUS: FINAL    RESULT Not Detected NO ORGANISMS FOUND IN SPECIMEN   COMPLETE METABOLIC PANEL WITH GFR  Result Value Ref Range   Glucose, Bld 205 (H) 65 - 99 mg/dL   BUN 14 7 - 25 mg/dL   Creat 0.79 0.70 - 1.30 mg/dL   eGFR 104 > OR = 60 mL/min/1.73m2   BUN/Creatinine Ratio NOT APPLICABLE 6 - 22 (calc)   Sodium 140 135 - 146 mmol/L   Potassium 3.9 3.5 - 5.3 mmol/L   Chloride 108 98 - 110 mmol/L   CO2 25 20 - 32 mmol/L   Calcium 8.6 8.6 - 10.3 mg/dL   Total Protein 7.0 6.1 - 8.1 g/dL   Albumin 3.5 (L) 3.6 - 5.1 g/dL   Globulin 3.5 1.9 - 3.7 g/dL (calc)   AG Ratio 1.0 1.0 - 2.5 (calc)    Total Bilirubin 1.5 (H) 0.2 - 1.2 mg/dL   Alkaline phosphatase (APISO) 63 35 - 144 U/L   AST 23 10 - 35 U/L   ALT 14 9 - 46 U/L  Hemoglobin A1c  Result Value Ref Range   Hgb A1c MFr Bld 8.6 (H) <5.7 % of total Hgb   Mean Plasma Glucose 200 mg/dL   eAG (mmol/L) 11.1 mmol/L  CBC with Differential/Platelet  Result Value Ref Range   WBC 3.5 (L) 3.8 - 10.8 Thousand/uL   RBC 4.64 4.20 - 5.80 Million/uL   Hemoglobin 8.8 (L) 13.2 - 17.1 g/dL   HCT 30.3 (L) 38.5 - 50.0 %   MCV 65.3 (L) 80.0 - 100.0 fL   MCH 19.0 (  L) 27.0 - 33.0 pg   MCHC 29.0 (L) 32.0 - 36.0 g/dL   RDW 18.0 (H) 11.0 - 15.0 %   Platelets 112 (L) 140 - 400 Thousand/uL   MPV  7.5 - 12.5 fL   Neutro Abs 1,988 1,500 - 7,800 cells/uL   Lymphs Abs 746 (L) 850 - 3,900 cells/uL   Absolute Monocytes 455 200 - 950 cells/uL   Eosinophils Absolute 263 15 - 500 cells/uL   Basophils Absolute 49 0 - 200 cells/uL   Neutrophils Relative % 56.8 %   Total Lymphocyte 21.3 %   Monocytes Relative 13.0 %   Eosinophils Relative 7.5 %   Basophils Relative 1.4 %  Microalbumin / creatinine urine ratio  Result Value Ref Range   Creatinine, Urine 229 20 - 320 mg/dL   Microalb, Ur 1.7 mg/dL   Microalb Creat Ratio 7 <30 mcg/mg creat  CBC MORPHOLOGY  Result Value Ref Range   CBC MORPHOLOGY  NORMAL  Hepatitis C antibody  Result Value Ref Range   Hepatitis C Ab NON-REACTIVE NON-REACTIVE  HIV Antibody (routine testing w rflx)  Result Value Ref Range   HIV 1&2 Ab, 4th Generation NON-REACTIVE NON-REACTIVE      Assessment & Plan:   Problem List Items Addressed This Visit   None    Follow up plan: No follow-ups on file.      

## 2022-02-01 DIAGNOSIS — Z419 Encounter for procedure for purposes other than remedying health state, unspecified: Secondary | ICD-10-CM | POA: Diagnosis not present

## 2022-03-02 DIAGNOSIS — Z419 Encounter for procedure for purposes other than remedying health state, unspecified: Secondary | ICD-10-CM | POA: Diagnosis not present

## 2022-03-31 NOTE — Progress Notes (Deleted)
Office Visit Note  Patient: Cole Pierce             Date of Birth: [birth year]           MRN: [id number]             PCP: Bo Merino, FNP Referring: Bo Merino, FNP Visit Date: 04/06/2022 Occupation: @GUAROCC @  Subjective:  No chief complaint on file.   History of Present Illness: Cole Pierce is a 57 y.o. male ***seen in consultation per request of his PCP for the evaluation of seropositive rheumatoid arthritis.  Patient has been under care of [facility] rheumatology for seropositive rheumatoid arthritis since January 2020.  At that time he had pain and swelling in his wrist joints, hands and his feet.  He was on methotrexate 15 mg p.o. weekly prior to seeing the rheumatologist at [facility].  The last visit was in June 2022.  On chart review he was on methotrexate 25 mg sq weekly along with leucovorin 5 mg, 3 tablets p.o. the day after methotrexate.  According to the office visit notes his rheumatoid arthritis was well-controlled methotrexate.  Patient did not follow-up with the rheumatology clinic and methotrexate was discontinued in May 2023.    Activities of Daily Living:  Patient reports morning stiffness for *** {minute/hour:19697}.   Patient {ACTIONS;DENIES/REPORTS:21021675::"Denies"} nocturnal pain.  Difficulty dressing/grooming: {ACTIONS;DENIES/REPORTS:21021675::"Denies"} Difficulty climbing stairs: {ACTIONS;DENIES/REPORTS:21021675::"Denies"} Difficulty getting out of chair: {ACTIONS;DENIES/REPORTS:21021675::"Denies"} Difficulty using hands for taps, buttons, cutlery, and/or writing: {ACTIONS;DENIES/REPORTS:21021675::"Denies"}  No Rheumatology ROS completed.   PMFS History:  Patient Active Problem List   Diagnosis Date Noted   Right inguinal hernia 08/22/2021   Calculus of gallbladder without cholecystitis without obstruction 08/22/2021   Type 2 diabetes mellitus without complication, without long-term current use of insulin ([place]) 01/19/2021   Cirrhosis of liver with  ascites ([place]) 01/19/2021   Abdominal fluid collection 01/19/2021   Iron deficiency anemia 07/07/2020   Thrombocytopenia ([place]) 99991111   Umbilical hernia [id number]   Seropositive rheumatoid arthritis ([place]) 03/25/2020    Past Medical History:  Diagnosis Date   Arthritis    Ascites    Cancer ([place])    Cirrhosis ([place])    Diabetes mellitus without complication (HCC)    Hernia, hiatal    IDA (iron deficiency anemia)    Inguinal hernia    right   Portal venous hypertension (HCC)    Renal cell carcinoma (HCC)    Splenomegaly    Thrombocytopenia ([place])     No family history on file. Past Surgical History:  Procedure Laterality Date   COLONOSCOPY N/A 06/19/2021   Procedure: COLONOSCOPY;  Surgeon: Lesly Rubenstein, MD;  Location: [facility] ENDOSCOPY;  Service: Endoscopy;  Laterality: N/A;   ESOPHAGOGASTRODUODENOSCOPY N/A 06/19/2021   Procedure: ESOPHAGOGASTRODUODENOSCOPY (EGD);  Surgeon: Lesly Rubenstein, MD;  Location: [facility] ENDOSCOPY;  Service: Endoscopy;  Laterality: N/A;   FRACTURE SURGERY     Social History   Social History Narrative   ** Merged History Encounter **       Immunization History  Administered Date(s) Administered   DTaP 05/11/1970, 07/14/1970, 09/14/1970   Hepb-cpg 07/03/2021, 09/08/2021   IPV 05/11/1970, 07/14/1970, 09/14/1970   Influenza,inj,Quad PF,6+ Mos 01/06/2019   Measles 12/14/1970   Moderna Sars-Covid-2 Vaccination 03/24/2019, 04/21/2019   Pneumococcal Conjugate-13 11/22/2017, 02/03/2019   Pneumococcal Polysaccharide-23 01/17/2018   Rubella 12/14/1970, 09/26/1977     Objective: Vital Signs: There were no vitals taken for this visit.   Physical Exam   Musculoskeletal Exam: ***  CDAI Exam: CDAI  Score: -- Patient Global: --; Provider Global: -- Swollen: --; Tender: -- Joint Exam 04/06/2022   No joint exam has been documented for this visit   There is currently no information documented on the homunculus. Go to the Rheumatology activity  and complete the homunculus joint exam.  Investigation: No additional findings.  Imaging: No results found.  Recent Labs: Lab Results  Component Value Date   WBC 3.5 (L) 07/03/2021   HGB 8.8 (L) 07/03/2021   PLT 112 (L) 07/03/2021   NA 140 07/03/2021   K 3.9 07/03/2021   CL 108 07/03/2021   CO2 25 07/03/2021   GLUCOSE 205 (H) 07/03/2021   BUN 14 07/03/2021   CREATININE 0.79 07/03/2021   BILITOT 1.5 (H) 07/03/2021   ALKPHOS 92 01/18/2021   AST 23 07/03/2021   ALT 14 07/03/2021   PROT 7.0 07/03/2021   ALBUMIN 2.8 (L) 01/18/2021   CALCIUM 8.6 07/03/2021    Speciality Comments: No specialty comments available.  Procedures:  No procedures performed Allergies: Zantac [ranitidine hcl] and Penicillins   Assessment / Plan:     Visit Diagnoses: No diagnosis found.  Orders: No orders of the defined types were placed in this encounter.  No orders of the defined types were placed in this encounter.   Face-to-face time spent with patient was *** minutes. Greater than 50% of time was spent in counseling and coordination of care.  Follow-Up Instructions: No follow-ups on file.   Bo Merino, MD  Note - This record has been created using Editor, commissioning.  Chart creation errors have been sought, but may not always  have been located. Such creation errors do not reflect on  the standard of medical care.

## 2022-04-02 DIAGNOSIS — Z419 Encounter for procedure for purposes other than remedying health state, unspecified: Secondary | ICD-10-CM | POA: Diagnosis not present

## 2022-04-02 DIAGNOSIS — K7460 Unspecified cirrhosis of liver: Secondary | ICD-10-CM | POA: Diagnosis not present

## 2022-04-02 DIAGNOSIS — B1910 Unspecified viral hepatitis B without hepatic coma: Secondary | ICD-10-CM | POA: Diagnosis not present

## 2022-04-02 DIAGNOSIS — M069 Rheumatoid arthritis, unspecified: Secondary | ICD-10-CM | POA: Diagnosis not present

## 2022-04-02 DIAGNOSIS — K429 Umbilical hernia without obstruction or gangrene: Secondary | ICD-10-CM | POA: Diagnosis not present

## 2022-04-02 DIAGNOSIS — K8020 Calculus of gallbladder without cholecystitis without obstruction: Secondary | ICD-10-CM | POA: Diagnosis not present

## 2022-04-02 DIAGNOSIS — R160 Hepatomegaly, not elsewhere classified: Secondary | ICD-10-CM | POA: Diagnosis not present

## 2022-04-02 DIAGNOSIS — R1909 Other intra-abdominal and pelvic swelling, mass and lump: Secondary | ICD-10-CM | POA: Diagnosis not present

## 2022-04-06 ENCOUNTER — Encounter: Payer: Commercial Managed Care - HMO | Admitting: Rheumatology

## 2022-04-06 DIAGNOSIS — Z79899 Other long term (current) drug therapy: Secondary | ICD-10-CM

## 2022-04-06 DIAGNOSIS — K7460 Unspecified cirrhosis of liver: Secondary | ICD-10-CM

## 2022-04-06 DIAGNOSIS — D508 Other iron deficiency anemias: Secondary | ICD-10-CM

## 2022-04-06 DIAGNOSIS — C641 Malignant neoplasm of right kidney, except renal pelvis: Secondary | ICD-10-CM

## 2022-04-06 DIAGNOSIS — D696 Thrombocytopenia, unspecified: Secondary | ICD-10-CM

## 2022-04-06 DIAGNOSIS — K8020 Calculus of gallbladder without cholecystitis without obstruction: Secondary | ICD-10-CM

## 2022-04-06 DIAGNOSIS — K429 Umbilical hernia without obstruction or gangrene: Secondary | ICD-10-CM

## 2022-04-06 DIAGNOSIS — Z91199 Patient's noncompliance with other medical treatment and regimen due to unspecified reason: Secondary | ICD-10-CM

## 2022-04-06 DIAGNOSIS — M059 Rheumatoid arthritis with rheumatoid factor, unspecified: Secondary | ICD-10-CM

## 2022-04-06 DIAGNOSIS — E119 Type 2 diabetes mellitus without complications: Secondary | ICD-10-CM

## 2022-04-06 DIAGNOSIS — K4090 Unilateral inguinal hernia, without obstruction or gangrene, not specified as recurrent: Secondary | ICD-10-CM

## 2022-04-11 DIAGNOSIS — K4090 Unilateral inguinal hernia, without obstruction or gangrene, not specified as recurrent: Secondary | ICD-10-CM | POA: Diagnosis not present

## 2022-04-11 DIAGNOSIS — K429 Umbilical hernia without obstruction or gangrene: Secondary | ICD-10-CM | POA: Diagnosis not present

## 2022-04-12 DIAGNOSIS — Z7952 Long term (current) use of systemic steroids: Secondary | ICD-10-CM | POA: Diagnosis not present

## 2022-04-12 DIAGNOSIS — M79641 Pain in right hand: Secondary | ICD-10-CM | POA: Diagnosis not present

## 2022-04-12 DIAGNOSIS — M79672 Pain in left foot: Secondary | ICD-10-CM | POA: Diagnosis not present

## 2022-04-12 DIAGNOSIS — M79671 Pain in right foot: Secondary | ICD-10-CM | POA: Diagnosis not present

## 2022-04-12 DIAGNOSIS — M2550 Pain in unspecified joint: Secondary | ICD-10-CM | POA: Diagnosis not present

## 2022-04-12 DIAGNOSIS — M069 Rheumatoid arthritis, unspecified: Secondary | ICD-10-CM | POA: Diagnosis not present

## 2022-04-12 DIAGNOSIS — M79642 Pain in left hand: Secondary | ICD-10-CM | POA: Diagnosis not present

## 2022-05-02 DIAGNOSIS — Z419 Encounter for procedure for purposes other than remedying health state, unspecified: Secondary | ICD-10-CM | POA: Diagnosis not present

## 2022-05-04 ENCOUNTER — Telehealth: Payer: Self-pay

## 2022-05-04 NOTE — Progress Notes (Signed)
   Care Guide Note  05/04/2022 Name: Cole Pierce MRN: [id number] DOB: 09-Aug-1965  Referred by: Berniece Salines, FNP Reason for referral : Care Coordination (Outreach to schedule with pharm d new mm dm)   Cole Pierce is a 57 y.o. year old male who is a primary care patient of Berniece Salines, FNP. Cole Pierce was referred to the pharmacist for assistance related to DM.    An unsuccessful telephone outreach was attempted today to contact the patient who was referred to the pharmacy team for assistance with medication management. Additional attempts will be made to contact the patient.   Penne Lash, RMA Care Guide [facility]  [place], [place] [address] Direct Dial: [phone] [email or web address]

## 2022-05-24 NOTE — Progress Notes (Signed)
   Care Guide Note  05/24/2022 Name: Cole Pierce MRN: [id number] DOB: 1965/07/01  Referred by: Berniece Salines, FNP Reason for referral : Care Coordination (Outreach to schedule with pharm d new mm dm)   Cole Pierce is a 57 y.o. year old male who is a primary care patient of Berniece Salines, FNP. Cole Pierce was referred to the pharmacist for assistance related to DM.    A second unsuccessful telephone outreach was attempted today to contact the patient who was referred to the pharmacy team for assistance with medication management. Additional attempts will be made to contact the patient.  Penne Lash, RMA Care Guide [facility]  [place], [place] [address] Direct Dial: [phone] [email or web address]

## 2022-06-02 DIAGNOSIS — Z419 Encounter for procedure for purposes other than remedying health state, unspecified: Secondary | ICD-10-CM | POA: Diagnosis not present

## 2022-06-07 NOTE — Progress Notes (Signed)
   Care Guide Note  06/07/2022 Name: Cole Pierce MRN: [id number] DOB: 06-21-1965  Referred by: Berniece Salines, FNP Reason for referral : Care Coordination (Outreach to schedule with pharm d new mm dm)   Cole Pierce is a 57 y.o. year old male who is a primary care patient of Berniece Salines, FNP. [name] was referred to the pharmacist for assistance related to DM.    A third unsuccessful telephone outreach was attempted today to contact the patient who was referred to the pharmacy team for assistance with medication management. The Population Health team is pleased to engage with this patient at any time in the future upon receipt of referral and should he/she be interested in assistance from the [facility] team.   Penne Lash, RMA Care Guide [facility]  [place], [place] [address] Direct Dial: [phone] [email or web address]

## 2022-07-02 DIAGNOSIS — Z419 Encounter for procedure for purposes other than remedying health state, unspecified: Secondary | ICD-10-CM | POA: Diagnosis not present

## 2022-07-08 ENCOUNTER — Emergency Department: Payer: Medicaid Other

## 2022-07-08 ENCOUNTER — Inpatient Hospital Stay
Admission: EM | Admit: 2022-07-08 | Discharge: 2022-07-11 | DRG: 432 | Disposition: A | Discharge status: Home / Self Care | Payer: Medicaid Other | Attending: Student | Admitting: Student

## 2022-07-08 ENCOUNTER — Other Ambulatory Visit: Payer: Self-pay

## 2022-07-08 ENCOUNTER — Encounter: Payer: Self-pay | Admitting: Emergency Medicine

## 2022-07-08 DIAGNOSIS — K7460 Unspecified cirrhosis of liver: Secondary | ICD-10-CM | POA: Diagnosis not present

## 2022-07-08 DIAGNOSIS — M059 Rheumatoid arthritis with rheumatoid factor, unspecified: Secondary | ICD-10-CM | POA: Diagnosis present

## 2022-07-08 DIAGNOSIS — E119 Type 2 diabetes mellitus without complications: Secondary | ICD-10-CM | POA: Diagnosis not present

## 2022-07-08 DIAGNOSIS — K766 Portal hypertension: Secondary | ICD-10-CM | POA: Diagnosis present

## 2022-07-08 DIAGNOSIS — J9811 Atelectasis: Secondary | ICD-10-CM | POA: Diagnosis present

## 2022-07-08 DIAGNOSIS — J90 Pleural effusion, not elsewhere classified: Secondary | ICD-10-CM | POA: Diagnosis present

## 2022-07-08 DIAGNOSIS — M545 Low back pain, unspecified: Secondary | ICD-10-CM | POA: Diagnosis present

## 2022-07-08 DIAGNOSIS — K7290 Hepatic failure, unspecified without coma: Secondary | ICD-10-CM | POA: Diagnosis present

## 2022-07-08 DIAGNOSIS — N179 Acute kidney failure, unspecified: Secondary | ICD-10-CM | POA: Diagnosis present

## 2022-07-08 DIAGNOSIS — K7469 Other cirrhosis of liver: Principal | ICD-10-CM | POA: Diagnosis present

## 2022-07-08 DIAGNOSIS — R188 Other ascites: Secondary | ICD-10-CM | POA: Diagnosis present

## 2022-07-08 DIAGNOSIS — K229 Disease of esophagus, unspecified: Secondary | ICD-10-CM | POA: Diagnosis present

## 2022-07-08 DIAGNOSIS — Z880 Allergy status to penicillin: Secondary | ICD-10-CM

## 2022-07-08 DIAGNOSIS — D6959 Other secondary thrombocytopenia: Secondary | ICD-10-CM | POA: Diagnosis present

## 2022-07-08 DIAGNOSIS — T502X5A Adverse effect of carbonic-anhydrase inhibitors, benzothiadiazides and other diuretics, initial encounter: Secondary | ICD-10-CM | POA: Diagnosis present

## 2022-07-08 DIAGNOSIS — R161 Splenomegaly, not elsewhere classified: Secondary | ICD-10-CM | POA: Diagnosis present

## 2022-07-08 DIAGNOSIS — D61818 Other pancytopenia: Secondary | ICD-10-CM

## 2022-07-08 DIAGNOSIS — N2889 Other specified disorders of kidney and ureter: Secondary | ICD-10-CM | POA: Diagnosis not present

## 2022-07-08 DIAGNOSIS — N1330 Unspecified hydronephrosis: Secondary | ICD-10-CM | POA: Diagnosis present

## 2022-07-08 DIAGNOSIS — F1011 Alcohol abuse, in remission: Secondary | ICD-10-CM | POA: Diagnosis present

## 2022-07-08 DIAGNOSIS — M1990 Unspecified osteoarthritis, unspecified site: Secondary | ICD-10-CM | POA: Diagnosis present

## 2022-07-08 DIAGNOSIS — R140 Abdominal distension (gaseous): Secondary | ICD-10-CM | POA: Diagnosis present

## 2022-07-08 DIAGNOSIS — Z7984 Long term (current) use of oral hypoglycemic drugs: Secondary | ICD-10-CM

## 2022-07-08 DIAGNOSIS — Z888 Allergy status to other drugs, medicaments and biological substances status: Secondary | ICD-10-CM

## 2022-07-08 DIAGNOSIS — Z6825 Body mass index (BMI) 25.0-25.9, adult: Secondary | ICD-10-CM

## 2022-07-08 DIAGNOSIS — R5381 Other malaise: Secondary | ICD-10-CM | POA: Diagnosis present

## 2022-07-08 DIAGNOSIS — K4090 Unilateral inguinal hernia, without obstruction or gangrene, not specified as recurrent: Secondary | ICD-10-CM | POA: Diagnosis present

## 2022-07-08 DIAGNOSIS — Z79899 Other long term (current) drug therapy: Secondary | ICD-10-CM

## 2022-07-08 DIAGNOSIS — K8020 Calculus of gallbladder without cholecystitis without obstruction: Secondary | ICD-10-CM | POA: Diagnosis present

## 2022-07-08 DIAGNOSIS — C649 Malignant neoplasm of unspecified kidney, except renal pelvis: Secondary | ICD-10-CM

## 2022-07-08 DIAGNOSIS — R9389 Abnormal findings on diagnostic imaging of other specified body structures: Secondary | ICD-10-CM | POA: Diagnosis not present

## 2022-07-08 DIAGNOSIS — F17290 Nicotine dependence, other tobacco product, uncomplicated: Secondary | ICD-10-CM | POA: Diagnosis present

## 2022-07-08 DIAGNOSIS — C641 Malignant neoplasm of right kidney, except renal pelvis: Secondary | ICD-10-CM | POA: Diagnosis not present

## 2022-07-08 DIAGNOSIS — I8510 Secondary esophageal varices without bleeding: Secondary | ICD-10-CM | POA: Diagnosis present

## 2022-07-08 DIAGNOSIS — D509 Iron deficiency anemia, unspecified: Secondary | ICD-10-CM | POA: Diagnosis present

## 2022-07-08 DIAGNOSIS — E43 Unspecified severe protein-calorie malnutrition: Secondary | ICD-10-CM | POA: Diagnosis present

## 2022-07-08 DIAGNOSIS — Z66 Do not resuscitate: Secondary | ICD-10-CM | POA: Diagnosis not present

## 2022-07-08 LAB — CBC WITH DIFFERENTIAL/PLATELET
Abs Immature Granulocytes: 0.01 K/uL (ref 0.00–0.07)
Basophils Absolute: 0 K/uL (ref 0.0–0.1)
Basophils Relative: 2 %
Eosinophils Absolute: 0 K/uL (ref 0.0–0.5)
Eosinophils Relative: 1 %
HCT: 32.8 % — ABNORMAL LOW (ref 39.0–52.0)
Hemoglobin: 9.4 g/dL — ABNORMAL LOW (ref 13.0–17.0)
Immature Granulocytes: 0 %
Lymphocytes Relative: 15 %
Lymphs Abs: 0.4 K/uL — ABNORMAL LOW (ref 0.7–4.0)
MCH: 19.3 pg — ABNORMAL LOW (ref 26.0–34.0)
MCHC: 28.7 g/dL — ABNORMAL LOW (ref 30.0–36.0)
MCV: 67.2 fL — ABNORMAL LOW (ref 80.0–100.0)
Monocytes Absolute: 0.4 K/uL (ref 0.1–1.0)
Monocytes Relative: 16 %
Neutro Abs: 1.7 K/uL (ref 1.7–7.7)
Neutrophils Relative %: 66 %
Platelets: 129 K/uL — ABNORMAL LOW (ref 150–400)
RBC: 4.88 MIL/uL (ref 4.22–5.81)
RDW: 18.6 % — ABNORMAL HIGH (ref 11.5–15.5)
WBC: 2.6 K/uL — ABNORMAL LOW (ref 4.0–10.5)
nRBC: 0 % (ref 0.0–0.2)

## 2022-07-08 LAB — PROTIME-INR
INR: 1.4 — ABNORMAL HIGH (ref 0.8–1.2)
Prothrombin Time: 17.5 seconds — ABNORMAL HIGH (ref 11.4–15.2)

## 2022-07-08 LAB — BODY FLUID CELL COUNT WITH DIFFERENTIAL
Eos, Fluid: 0 %
Lymphs, Fluid: 27 %
Monocyte-Macrophage-Serous Fluid: 68 %
Neutrophil Count, Fluid: 5 %
Total Nucleated Cell Count, Fluid: 201 cu mm

## 2022-07-08 LAB — COMPREHENSIVE METABOLIC PANEL WITH GFR
ALT: 16 U/L (ref 0–44)
AST: 27 U/L (ref 15–41)
Albumin: 3 g/dL — ABNORMAL LOW (ref 3.5–5.0)
Alkaline Phosphatase: 78 U/L (ref 38–126)
Anion gap: 8 (ref 5–15)
BUN: 16 mg/dL (ref 6–20)
CO2: 21 mmol/L — ABNORMAL LOW (ref 22–32)
Calcium: 8.3 mg/dL — ABNORMAL LOW (ref 8.9–10.3)
Chloride: 108 mmol/L (ref 98–111)
Creatinine, Ser: 0.97 mg/dL (ref 0.61–1.24)
GFR, Estimated: >60 mL/min (ref >60)
Glucose, Bld: 161 mg/dL — ABNORMAL HIGH (ref 70–99)
Potassium: 3.8 mmol/L (ref 3.5–5.1)
Sodium: 137 mmol/L (ref 135–145)
Total Bilirubin: 1.5 mg/dL — ABNORMAL HIGH (ref 0.3–1.2)
Total Protein: 7.9 g/dL (ref 6.5–8.1)

## 2022-07-08 LAB — BODY FLUID CULTURE W GRAM STAIN

## 2022-07-08 LAB — SODIUM, URINE, RANDOM: Sodium, Ur: 129 mmol/L

## 2022-07-08 LAB — URINALYSIS, ROUTINE W REFLEX MICROSCOPIC
Bilirubin Urine: NEGATIVE
Glucose, UA: NEGATIVE mg/dL
Hgb urine dipstick: NEGATIVE
Ketones, ur: NEGATIVE mg/dL
Leukocytes,Ua: NEGATIVE
Nitrite: NEGATIVE
Protein, ur: NEGATIVE mg/dL
Specific Gravity, Urine: 1.008 (ref 1.005–1.030)
pH: 7 (ref 5.0–8.0)

## 2022-07-08 LAB — GLUCOSE, PLEURAL OR PERITONEAL FLUID: Glucose, Fluid: 151 mg/dL

## 2022-07-08 LAB — GLUCOSE, CAPILLARY
Glucose-Capillary: 144 mg/dL — ABNORMAL HIGH (ref 70–99)
Glucose-Capillary: 147 mg/dL — ABNORMAL HIGH (ref 70–99)

## 2022-07-08 LAB — LACTATE DEHYDROGENASE, PLEURAL OR PERITONEAL FLUID: LD, Fluid: 63 U/L — ABNORMAL HIGH (ref 3–23)

## 2022-07-08 LAB — MAGNESIUM: Magnesium: 2.1 mg/dL (ref 1.7–2.4)

## 2022-07-08 LAB — PROTEIN, PLEURAL OR PERITONEAL FLUID: Total protein, fluid: <3 g/dL

## 2022-07-08 MED ORDER — LIDOCAINE 5 % EX PTCH
1.0000 | MEDICATED_PATCH | CUTANEOUS | Status: DC
  Administered 2022-07-08 – 2022-07-10 (×3): 1 via TRANSDERMAL
  Filled 2022-07-08 (×4): qty 1

## 2022-07-08 MED ORDER — FENTANYL CITRATE PF 50 MCG/ML IJ SOSY
50.0000 ug | PREFILLED_SYRINGE | Freq: Once | INTRAMUSCULAR | Status: AC
  Administered 2022-07-08: 50 ug via INTRAVENOUS
  Filled 2022-07-08: qty 1

## 2022-07-08 MED ORDER — CYCLOBENZAPRINE HCL 10 MG PO TABS
5.0000 mg | ORAL_TABLET | Freq: Two times a day (BID) | ORAL | Status: DC | PRN
  Administered 2022-07-08 – 2022-07-11 (×4): 5 mg via ORAL
  Filled 2022-07-08 (×4): qty 1

## 2022-07-08 MED ORDER — ONDANSETRON HCL 4 MG PO TABS: 4.0000 mg | ORAL_TABLET | Freq: Four times a day (QID) | ORAL | Status: DC | PRN

## 2022-07-08 MED ORDER — METRONIDAZOLE 500 MG/100ML IV SOLN
500.0000 mg | Freq: Once | INTRAVENOUS | Status: DC
  Administered 2022-07-08: 500 mg via INTRAVENOUS
  Filled 2022-07-08: qty 100

## 2022-07-08 MED ORDER — FUROSEMIDE 10 MG/ML IJ SOLN
20.0000 mg | Freq: Once | INTRAMUSCULAR | Status: AC
  Administered 2022-07-08: 20 mg via INTRAVENOUS
  Filled 2022-07-08: qty 4

## 2022-07-08 MED ORDER — INSULIN ASPART 100 UNIT/ML IJ SOLN
0.0000 [IU] | Freq: Three times a day (TID) | INTRAMUSCULAR | Status: DC
  Administered 2022-07-08: 2 [IU] via SUBCUTANEOUS
  Administered 2022-07-09 (×2): 3 [IU] via SUBCUTANEOUS
  Filled 2022-07-08: qty 1

## 2022-07-08 MED ORDER — ACETAMINOPHEN 650 MG RE SUPP: 650.0000 mg | Freq: Four times a day (QID) | RECTAL | Status: DC | PRN

## 2022-07-08 MED ORDER — POLYETHYLENE GLYCOL 3350 17 G PO PACK: 17.0000 g | PACK | Freq: Every day | ORAL | Status: DC | PRN

## 2022-07-08 MED ORDER — HYDROMORPHONE HCL 1 MG/ML IJ SOLN
0.5000 mg | INTRAMUSCULAR | Status: DC | PRN
  Administered 2022-07-08 – 2022-07-11 (×15): 0.5 mg via INTRAVENOUS
  Filled 2022-07-08 (×15): qty 0.5

## 2022-07-08 MED ORDER — LIDOCAINE-EPINEPHRINE 2 %-1:100000 IJ SOLN
20.0000 mL | Freq: Once | INTRAMUSCULAR | Status: AC
  Administered 2022-07-08: 4 mL via INTRADERMAL
  Filled 2022-07-08: qty 1

## 2022-07-08 MED ORDER — ONDANSETRON HCL 4 MG/2ML IJ SOLN
4.0000 mg | Freq: Four times a day (QID) | INTRAMUSCULAR | Status: DC | PRN
  Administered 2022-07-09 – 2022-07-11 (×4): 4 mg via INTRAVENOUS
  Filled 2022-07-08 (×5): qty 2

## 2022-07-08 MED ORDER — HYDROMORPHONE HCL 1 MG/ML IJ SOLN
1.0000 mg | Freq: Once | INTRAMUSCULAR | Status: AC
  Administered 2022-07-08: 1 mg via INTRAVENOUS
  Filled 2022-07-08: qty 1

## 2022-07-08 MED ORDER — SODIUM CHLORIDE 0.9% FLUSH
3.0000 mL | Freq: Two times a day (BID) | INTRAVENOUS | Status: DC
  Administered 2022-07-08 – 2022-07-11 (×7): 3 mL via INTRAVENOUS

## 2022-07-08 MED ORDER — ONDANSETRON HCL 4 MG/2ML IJ SOLN
4.0000 mg | Freq: Once | INTRAMUSCULAR | Status: AC
  Administered 2022-07-08: 4 mg via INTRAVENOUS
  Filled 2022-07-08: qty 2

## 2022-07-08 MED ORDER — ALBUMIN HUMAN 25 % IV SOLN
12.5000 g | Freq: Once | INTRAVENOUS | Status: AC
  Administered 2022-07-08: 12.5 g via INTRAVENOUS
  Filled 2022-07-08: qty 50

## 2022-07-08 MED ORDER — ACETAMINOPHEN 325 MG PO TABS
650.0000 mg | ORAL_TABLET | Freq: Four times a day (QID) | ORAL | Status: DC | PRN
  Administered 2022-07-09: 650 mg via ORAL
  Filled 2022-07-08: qty 2

## 2022-07-08 MED ORDER — FUROSEMIDE 40 MG PO TABS
40.0000 mg | ORAL_TABLET | Freq: Every day | ORAL | Status: DC
  Administered 2022-07-08 – 2022-07-09 (×2): 40 mg via ORAL
  Filled 2022-07-08 (×2): qty 1

## 2022-07-08 MED ORDER — SPIRONOLACTONE 25 MG PO TABS
100.0000 mg | ORAL_TABLET | Freq: Every day | ORAL | Status: DC
  Administered 2022-07-08 – 2022-07-09 (×2): 100 mg via ORAL
  Filled 2022-07-08 (×2): qty 4

## 2022-07-08 MED ORDER — SODIUM CHLORIDE 0.9 % IV SOLN
2.0000 g | Freq: Once | INTRAVENOUS | Status: AC
  Administered 2022-07-08: 2 g via INTRAVENOUS
  Filled 2022-07-08: qty 20

## 2022-07-08 NOTE — Assessment & Plan Note (Signed)
Patient has a history of rheumatoid arthritis with intermittent pain in his hands and feet.  Not currently on any DMARDs.  He has established with rheumatology.

## 2022-07-08 NOTE — ED Notes (Signed)
Dr Erma Heritage at bedside for paracentesis

## 2022-07-08 NOTE — Assessment & Plan Note (Addendum)
History of suspected RCC first discovered in January 2023.  Patient has followed up with Dr. Michaelle Birks at [facility] neurology, however he was recommended to establish with a tertiary center due to complicated nature.   Discussed with urology; patient will require repeat imaging for restaging giving a large mass is at risk for metastasis.  Depending on results from repeat imaging, will either need consultation with urology if it remains localized versus oncology if it has metastasized.  Given he has received Lasix today and a paracentesis we will schedule the CT for tomorrow to avoid nephrotoxicity.

## 2022-07-08 NOTE — Assessment & Plan Note (Signed)
Patient presenting with pancytopenia that is relatively stable at this time.  Will need outpatient follow-up.  - Repeat CBC in the a.m.

## 2022-07-08 NOTE — ED Notes (Signed)
Pt refused to allow triage tech to obtain labs. Pt using profanity and getting verbally aggressive. Security present. Will wait until roomed to obtain labs.

## 2022-07-08 NOTE — ED Notes (Signed)
Patient transported to X-ray 

## 2022-07-08 NOTE — ED Notes (Signed)
Advised nurse that patient has ready bed 

## 2022-07-08 NOTE — Assessment & Plan Note (Addendum)
Patient is noting severe right low back and flank pain that began around midnight and has persisted since. He has a history of suspected renal cell carcinoma with CT and February 2023 demonstrating abutment onto the fascia and right psoas muscle without invasion.  Given location of pain, I am concerned this may represent progression.  It is possible this is just musculoskeletal as well given distention with ascites.  - CT renal abdomen with and without contrast tomorrow to avoid nephrotoxicity given paracentesis and Lasix today - Dilaudid as needed for severe pain

## 2022-07-08 NOTE — ED Triage Notes (Signed)
Pt presents ambulatory to triage via POV with complaints of R sided flank pain x 1 hour. Hx of kidney stones and notes that it "feels the same". Rates the pain 10/10 - no pain meds taken PTA.  A&Ox4 at this time. Denies hematuria, N/V/D, CP or SOB.

## 2022-07-08 NOTE — Consult Note (Signed)
[facility] GI Inpatient Consult Note   Jamey Reas, M.D.  Reason for Consult: Massive ascites, cirrhosis   Attending Requesting Consult: Verdene Lennert, M.D.   History of Present Illness: Cole Pierce is a 57 y.o. male with history of heavy alcohol use remotely, none significant in the last several years.  He has been incarcerated for 13 years span from 2008-2021.  He was a prisoner who underwent tattoos while in prison and he also endorses a history of intravenous drug use.  He has been worked up in the past for viral hepatitis which is negative for A, B, and C.  He did report excessive whiskey intake many years ago but says that he hasn't drank alcohol "in 14 years".  He was initially seen in our office by Jacob Moores, PA-C on 03/27/2021 for iron deficiency and presumptive cirrhosis. He was scheduled for EGD and colonoscopy, but canceled and has not rescheduled. The patient has had no regular medical care of liver disease. H was seen April 2024 by Dr. Roderic Scarce of Gen Surgery and referred to [facility] for repair of right inguinal hernia due to deemed higher surgical risk secondary to cirrhosis. Patient never saw them at [facility].   Past Medical History:  Past Medical History:  Diagnosis Date   Arthritis    Ascites    Cancer (HCC)    Cirrhosis (HCC)    Diabetes mellitus without complication (HCC)    Hernia, hiatal    IDA (iron deficiency anemia)    Inguinal hernia    right   Portal venous hypertension (HCC)    Renal cell carcinoma (HCC)    Splenomegaly    Thrombocytopenia (HCC)     Problem List: Patient Active Problem List   Diagnosis Date Noted   Decompensated hepatic cirrhosis (HCC) 07/08/2022   Pancytopenia (HCC) 07/08/2022   Renal cell carcinoma (HCC) 07/08/2022   Right low back pain 07/08/2022   Right inguinal hernia 08/22/2021   Calculus of gallbladder without cholecystitis without obstruction 08/22/2021   Type 2 diabetes mellitus without complication,  without long-term current use of insulin (HCC) 01/19/2021   Cirrhosis of liver with ascites (HCC) 01/19/2021   Abdominal fluid collection 01/19/2021   Iron deficiency anemia 07/07/2020   Thrombocytopenia (HCC) 07/07/2020   Umbilical hernia 07/07/2020   Seropositive rheumatoid arthritis (HCC) 03/25/2020    Past Surgical History: Past Surgical History:  Procedure Laterality Date   COLONOSCOPY N/A 06/19/2021   Procedure: COLONOSCOPY;  Surgeon: Regis Bill, MD;  Location: [facility] ENDOSCOPY;  Service: Endoscopy;  Laterality: N/A;   ESOPHAGOGASTRODUODENOSCOPY N/A 06/19/2021   Procedure: ESOPHAGOGASTRODUODENOSCOPY (EGD);  Surgeon: Regis Bill, MD;  Location: [facility] ENDOSCOPY;  Service: Endoscopy;  Laterality: N/A;   FRACTURE SURGERY      Allergies: Allergies  Allergen Reactions   Zantac [Ranitidine Hcl] Anaphylaxis   Penicillins Hives    Hives in mouth    Home Medications: (Not in a hospital admission)  Home medication reconciliation was completed with the patient.   Scheduled Inpatient Medications:    insulin aspart  0-15 Units Subcutaneous TID WC   sodium chloride flush  3 mL Intravenous Q12H    Continuous Inpatient Infusions:    PRN Inpatient Medications:  acetaminophen **OR** acetaminophen, HYDROmorphone (DILAUDID) injection, ondansetron **OR** ondansetron (ZOFRAN) IV, polyethylene glycol  Family History: family history is not on file.   GI Family History: Negative  Social History:   reports that he has been smoking cigars. He has never used smokeless tobacco. He reports  that he does not currently use alcohol. He reports that he does not use drugs. The patient denies ETOH, tobacco, or drug use.    Review of Systems: Review of Systems - Negative except HPI  Physical Examination: BP 123/61   Pulse 83   Temp 98.6 F (37 C) (Oral)   Resp 14   Ht 5\' 8"  (1.727 m)   Wt 79.8 kg   SpO2 95%   BMI 26.76 kg/m  Physical Exam Constitutional:      Appearance:  Normal appearance. He is not ill-appearing, toxic-appearing or diaphoretic.  HENT:     Head: Normocephalic and atraumatic.     Nose: Nose normal.     Mouth/Throat:     Pharynx: Oropharynx is clear. No oropharyngeal exudate.  Eyes:     General: No scleral icterus.       Right eye: No discharge.        Left eye: No discharge.     Pupils: Pupils are equal, round, and reactive to light.  Cardiovascular:     Rate and Rhythm: Normal rate.     Pulses: Normal pulses.     Heart sounds: No murmur heard.    No gallop.  Pulmonary:     Effort: Pulmonary effort is normal.     Breath sounds: No wheezing.     Comments: Decreased breath sounds in right lower lung fields Abdominal:     General: Bowel sounds are normal. There is distension.     Palpations: There is shifting dullness. There is no mass.     Tenderness: There is no abdominal tenderness. There is no guarding or rebound.  Musculoskeletal:        General: No swelling, tenderness or deformity. Normal range of motion.     Cervical back: Normal range of motion.  Skin:    General: Skin is warm and dry.     Coloration: Skin is not jaundiced.  Neurological:     General: No focal deficit present.     Mental Status: He is alert.  Psychiatric:        Mood and Affect: Mood normal.        Thought Content: Thought content normal.        Judgment: Judgment normal.     Data: Lab Results  Component Value Date   WBC 2.6 (L) 07/08/2022   HGB 9.4 (L) 07/08/2022   HCT 32.8 (L) 07/08/2022   MCV 67.2 (L) 07/08/2022   PLT 129 (L) 07/08/2022   Recent Labs  Lab 07/08/22 0739  HGB 9.4*   Lab Results  Component Value Date   NA 137 07/08/2022   K 3.8 07/08/2022   CL 108 07/08/2022   CO2 21 (L) 07/08/2022   BUN 16 07/08/2022   CREATININE 0.97 07/08/2022   Lab Results  Component Value Date   ALT 16 07/08/2022   AST 27 07/08/2022   ALKPHOS 78 07/08/2022   BILITOT 1.5 (H) 07/08/2022   Recent Labs  Lab 07/08/22 0810  INR 1.4*       Latest Ref Rng & Units 07/08/2022    7:39 AM 07/03/2021   10:05 AM 01/18/2021   12:22 PM  CBC  WBC 4.0 - 10.5 K/uL 2.6  3.5  4.8   Hemoglobin 13.0 - 17.0 g/dL 9.4  8.8  9.0   Hematocrit 39.0 - 52.0 % 32.8  30.3  31.5   Platelets 150 - 400 K/uL 129  112  108     STUDIES: DG Chest  2 View  Result Date: 07/08/2022 CLINICAL DATA:  Pleural effusion. EXAM: CHEST - 2 VIEW COMPARISON:  11/11/2007.  Current abdomen and pelvis CT FINDINGS: Cardiac silhouette is normal in size. No mediastinal or hilar masses. No evidence of adenopathy. Elevated right hemidiaphragm. Small right pleural effusion, that appears larger on the current CT. No left pleural effusion. Mild dependent opacity at the right lung base consistent with atelectasis, stable from the current CT. Remainder of the lungs is clear. No pneumothorax. Skeletal structures are intact. IMPRESSION: 1. Small right pleural effusion, which appears larger on the current CT of the abdomen and pelvis. Mild associated dependent right lower lobe atelectasis. 2. No acute cardiopulmonary disease. Electronically Signed   By: Amie Portland M.D.   On: 07/08/2022 12:54   CT Renal Stone Study  Result Date: 07/08/2022 CLINICAL DATA:  Abdominal/flank pain with stone suspected EXAM: CT ABDOMEN AND PELVIS WITHOUT CONTRAST TECHNIQUE: Multidetector CT imaging of the abdomen and pelvis was performed following the standard protocol without IV contrast. RADIATION DOSE REDUCTION: This exam was performed according to the departmental dose-optimization program which includes automated exposure control, adjustment of the mA and/or kV according to patient size and/or use of iterative reconstruction technique. COMPARISON:  02/21/2021 FINDINGS: Lower chest: Moderate right pleural effusion since prior, with lower lobe atelectasis. Hepatobiliary: No focal liver abnormality.Cirrhotic liver. Layering cholelithiasis. Stable gallbladder distension without evidence of superimposed acute inflammation  Pancreas: Unremarkable. Spleen: Enlarged in the setting of portal hypertension, 16 cm anterior to posterior. Adrenals/Urinary Tract: Negative adrenals. No hydronephrosis or stone. Partially calcified right lower pole renal mass which appears solid and measures 5.6 cm. Unremarkable bladder. Stomach/Bowel: No obstruction. No visible bowel inflammation including appendicitis Vascular/Lymphatic: Scattered atheromatous calcification . No noted mass or adenopathy Reproductive:No pathologic findings. Other: Large volume ascites in the setting of cirrhosis. Moderate umbilical and large right inguinal hernias which contain ascitic fluid. Musculoskeletal: No acute abnormalities. IMPRESSION: 1. Cirrhosis with large volume ascites and new moderate to large right pleural effusion. There is a large right inguinal hernia distended by ascitic fluid. 2. Known solid right renal mass, presumed renal cell carcinoma. 3. Cholelithiasis. Electronically Signed   By: Tiburcio Pea M.D.   On: 07/08/2022 06:58   @IMAGES @  Assessment:: 57 year old male with a history of "cryptogenic cirrhosis", possible influenced by alcohol use in the past and history of methotrexate use long-term (4 years) for rheumatoid arthritis.  Has risk factors but has tested negative for viral hepatitis within the past year or so.  MELD score is 12.  Besides ascites, patient exhibits no other endorgan findings of decompensated cirrhosis.  Remote history of IV drug abuse and alcohol abuse.  Patient maintains he has never been alcohol dependent.  He reports not to drink any alcohol in 14 years.  Child Pugh Class "B" Cirrhosis - 13 points. MELD score of 12, both suggest moderate disease at present.  Abdominal ascites - s/p 5 L LVP . No evidence of SBP. No fluid albumin measured, but assume it is HIGH SAAG ascites from portal hypertension given undetectable protein in peritoneal fluid. Type II DM. Hx of Rheumatoid arthritis. Now off Methotrexate x 4 months, with  dx of cirrhosis being made 18 months ago. Right Renal cell CA - patient seen by DR. [facility] of urology and referred to specialty center, patient has yet to obtain consultation for resection. Low back pain Thrombocytopenia secondary to cirrhosis. History of poor medical follow up.  Recommendations: 2 gram sodium diet. Begin po 100mg  spironolactone daily and  lasix 40mg  daily.  Daily weights, strict I/O. Patient is very stable without any acute exacerbations of liver disease other than uncontrolled ascites.   Thank you for the consult. Please call with questions or concerns.  Rosina Lowenstein, "Mellody Dance MD [facility] [address] [place], [place] [address] [phone]  07/08/2022 2:10 PM

## 2022-07-08 NOTE — Assessment & Plan Note (Signed)
History of decompensated cirrhosis with ascites and renomegaly now coming in with worsening ascites rapidly over the last 1 week.  SBP has been ruled out with paracentesis.  Paracentesis has also been therapeutic in nature with 5 L removed.  - GI consulted; appreciate their recommendations - Diuretics per GI as patient is normotensive and may have difficulty tolerating - Telemetry monitoring given large volume removal of fluids

## 2022-07-08 NOTE — H&P (Addendum)
History and Physical    Patient: Cole Pierce ZOX:[id number] DOB: 1965/01/23 DOA: 07/08/2022 DOS: the patient was seen and examined on 07/08/2022 PCP: Berniece Salines, FNP  Patient coming from: Home  Chief Complaint:  Chief Complaint  Patient presents with   Flank Pain   HPI: Cole Pierce is a 57 y.o. male with medical history significant of decompensated cirrhosis with ascites and esophageal varices of unknown etiology, type 2 diabetes, iron deficiency anemia, suspected renal cell carcinoma, rheumatoid arthritis not on DMARDs, who presents to the ED due to flank pain.  [name] states that he has had fluid in his stomach for at least a year now, however over the last week, it seemed to rapidly worsen and his skin felt extremely tight.  Due to this, he was having trouble with passing urine and having bowel movements.  He endorses intermittent pain with urination but feels that it is due to having to sit to urinate.  He denies any melena.  He denies any chest pain or shortness of breath.  He notes that over the last week, he has had a productive cough that he assumed may have been viral.  He denies any fever, chills.  He endorses bilateral lower extremity swelling that improves with elevation.  [name] also endorses severe right flank and back pain that started around midnight and has persisted since.  He states that this has occurred in the past but usually resolve resolved after a couple hours.  This episode is different due to persistence.  ED course: On arrival to the ED, patient was hypertensive at 155/69 with heart rate of 99.  He was saturating at 100% on room air.  He was afebrile at 98.6.  Workup notable for WBC of 2.6, hemoglobin of 9.4, platelets of 129, INR 1.4, bicarb 21, glucose 161, creatinine 0.97 GFR above 60, albumin 3.0 total bilirubin of 1.5. CT renal stone study demonstrated cirrhosis with large volume ascites with new moderate to large right pleural effusion in  addition to large right inguinal hernia and distended by ascitic fluid, known right renal mass presumed RCC.  Patient underwent paracentesis in the ED with 5 L removed, data pending.  TRH contacted for admission.  Review of Systems: As mentioned in the history of present illness. All other systems reviewed and are negative.  Past Medical History:  Diagnosis Date   Arthritis    Ascites    Cancer (HCC)    Cirrhosis (HCC)    Diabetes mellitus without complication (HCC)    Hernia, hiatal    IDA (iron deficiency anemia)    Inguinal hernia    right   Portal venous hypertension (HCC)    Renal cell carcinoma (HCC)    Splenomegaly    Thrombocytopenia (HCC)    Past Surgical History:  Procedure Laterality Date   COLONOSCOPY N/A 06/19/2021   Procedure: COLONOSCOPY;  Surgeon: Regis Bill, MD;  Location: [facility] ENDOSCOPY;  Service: Endoscopy;  Laterality: N/A;   ESOPHAGOGASTRODUODENOSCOPY N/A 06/19/2021   Procedure: ESOPHAGOGASTRODUODENOSCOPY (EGD);  Surgeon: Regis Bill, MD;  Location: [facility] ENDOSCOPY;  Service: Endoscopy;  Laterality: N/A;   FRACTURE SURGERY     Social History:  reports that he has been smoking cigars. He has never used smokeless tobacco. He reports that he does not currently use alcohol. He reports that he does not use drugs.  Allergies  Allergen Reactions   Zantac [Ranitidine Hcl] Anaphylaxis   Penicillins Hives    Hives in mouth  History reviewed. No pertinent family history.  Prior to Admission medications   Medication Sig Start Date End Date Taking? Authorizing Provider  predniSONE (DELTASONE) 5 MG tablet Take 5 mg by mouth daily as needed.   Yes [provider]  Blood Glucose Monitoring Suppl w/Device KIT 1 each by Does not apply route daily. 04/03/21   Berniece Salines, FNP  dapagliflozin propanediol (FARXIGA) 5 MG TABS tablet Take 1 tablet (5 mg total) by mouth daily before breakfast. Patient not taking: Reported on 07/08/2022 08/15/21    Berniece Salines, FNP  glipiZIDE (GLUCOTROL) 5 MG tablet Take 1 tablet (5 mg total) by mouth 2 (two) times daily before a meal. Patient not taking: Reported on 07/08/2022 04/03/21   Berniece Salines, FNP  Glucose Blood (BLOOD GLUCOSE TEST STRIPS) STRP Use as directed to monitor FSBS once daily for DM 04/03/21   Berniece Salines, FNP  hydrocortisone 2.5 % cream Apply to itchy rash on the face, groin, and underarms BID up to two weeks. Patient not taking: Reported on 07/08/2022 07/06/21   Neale Burly, [place], MD  hydrOXYzine (VISTARIL) 25 MG capsule Take 1 capsule (25 mg total) by mouth every 8 (eight) hours as needed for itching. Patient not taking: Reported on 07/08/2022 06/20/21   Berniece Salines, FNP  ivermectin (STROMECTOL) 3 MG TABS tablet Take 4 tablets (12,000 mcg total) by mouth as directed. Take 4 tablets by mouth today. In one week, take 4 more tablets by mouth. Patient not taking: Reported on 07/08/2022 07/18/21   Neale Burly, [place], MD  permethrin (ELIMITE) 5 % cream Apply to entire body other than face - let sit for 14 hours then wash off, may repeat in 1 week if still having symptoms Patient not taking: Reported on 07/08/2022 06/20/21   Berniece Salines, FNP  triamcinolone cream (KENALOG) 0.1 % Apply 1 Application topically 2 (two) times daily as needed. Avoid applying to face, groin, and axilla. Use as directed. Long-term use can cause thinning of the skin. Patient not taking: Reported on 07/08/2022 08/21/21   Sandi Mealy, MD    Physical Exam: Vitals:   07/08/22 1133 07/08/22 1215 07/08/22 1530 07/08/22 1600  BP:  123/61 (!) 141/101 138/85  Pulse:  83 78 93  Resp:  14  19  Temp:      TempSrc:      SpO2: 95%     Weight:      Height:       Physical Exam Vitals and nursing note reviewed.  Constitutional:      General: He is not in acute distress.    Appearance: He is normal weight.  HENT:     Head: Normocephalic and atraumatic.     Mouth/Throat:     Mouth: Mucous membranes are moist.     Pharynx:  Oropharynx is clear.  Eyes:     Conjunctiva/sclera: Conjunctivae normal.     Pupils: Pupils are equal, round, and reactive to light.  Cardiovascular:     Rate and Rhythm: Normal rate and regular rhythm.     Heart sounds: No murmur heard. Pulmonary:     Effort: Pulmonary effort is normal. No tachypnea or accessory muscle usage.     Breath sounds: Decreased breath sounds (No breathe sounds on the right) present. No wheezing, rhonchi or rales.  Abdominal:     General: Bowel sounds are normal. There is no distension.     Palpations: Abdomen is soft.     Hernia: A hernia is present.  Musculoskeletal:     Right lower leg: No edema.     Left lower leg: No edema.  Skin:    General: Skin is warm and dry.  Neurological:     Mental Status: He is alert and oriented to person, place, and time. Mental status is at baseline.  Psychiatric:        Mood and Affect: Mood normal.        Behavior: Behavior normal.    Data Reviewed: CBC with WBC of 2.6, hemoglobin 9.4, MCV 67 and platelets 129 CMP with sodium of 137, potassium 3.8, bicarb 21, glucose 161, creatinine 0.97, AST 27, ALT 16 and GFR above 60 INR 1.4 Urinalysis with no concerning findings  Peritoneal fluid data pending.  DG Chest 2 View  Result Date: 07/08/2022 CLINICAL DATA:  Pleural effusion. EXAM: CHEST - 2 VIEW COMPARISON:  11/11/2007.  Current abdomen and pelvis CT FINDINGS: Cardiac silhouette is normal in size. No mediastinal or hilar masses. No evidence of adenopathy. Elevated right hemidiaphragm. Small right pleural effusion, that appears larger on the current CT. No left pleural effusion. Mild dependent opacity at the right lung base consistent with atelectasis, stable from the current CT. Remainder of the lungs is clear. No pneumothorax. Skeletal structures are intact. IMPRESSION: 1. Small right pleural effusion, which appears larger on the current CT of the abdomen and pelvis. Mild associated dependent right lower lobe atelectasis.  2. No acute cardiopulmonary disease. Electronically Signed   By: Amie Portland M.D.   On: 07/08/2022 12:54   CT Renal Stone Study  Result Date: 07/08/2022 CLINICAL DATA:  Abdominal/flank pain with stone suspected EXAM: CT ABDOMEN AND PELVIS WITHOUT CONTRAST TECHNIQUE: Multidetector CT imaging of the abdomen and pelvis was performed following the standard protocol without IV contrast. RADIATION DOSE REDUCTION: This exam was performed according to the departmental dose-optimization program which includes automated exposure control, adjustment of the mA and/or kV according to patient size and/or use of iterative reconstruction technique. COMPARISON:  02/21/2021 FINDINGS: Lower chest: Moderate right pleural effusion since prior, with lower lobe atelectasis. Hepatobiliary: No focal liver abnormality.Cirrhotic liver. Layering cholelithiasis. Stable gallbladder distension without evidence of superimposed acute inflammation Pancreas: Unremarkable. Spleen: Enlarged in the setting of portal hypertension, 16 cm anterior to posterior. Adrenals/Urinary Tract: Negative adrenals. No hydronephrosis or stone. Partially calcified right lower pole renal mass which appears solid and measures 5.6 cm. Unremarkable bladder. Stomach/Bowel: No obstruction. No visible bowel inflammation including appendicitis Vascular/Lymphatic: Scattered atheromatous calcification . No noted mass or adenopathy Reproductive:No pathologic findings. Other: Large volume ascites in the setting of cirrhosis. Moderate umbilical and large right inguinal hernias which contain ascitic fluid. Musculoskeletal: No acute abnormalities. IMPRESSION: 1. Cirrhosis with large volume ascites and new moderate to large right pleural effusion. There is a large right inguinal hernia distended by ascitic fluid. 2. Known solid right renal mass, presumed renal cell carcinoma. 3. Cholelithiasis. Electronically Signed   By: Tiburcio Pea M.D.   On: 07/08/2022 06:58    Results  are pending, will review when available.  Assessment and Plan:  * Decompensated hepatic cirrhosis (HCC) History of decompensated cirrhosis with ascites and renomegaly now coming in with worsening ascites rapidly over the last 1 week.  SBP has been ruled out with paracentesis.  Paracentesis has also been therapeutic in nature with 5 L removed.  - GI consulted; appreciate their recommendations - Diuretics per GI as patient is normotensive and may have difficulty tolerating - Telemetry monitoring given large volume removal of fluids  Pancytopenia [facility]) Patient presenting with pancytopenia that is relatively stable at this time.  Will need outpatient follow-up.  - Repeat CBC in the a.m.  Right low back pain Patient is noting severe right low back and flank pain that began around midnight and has persisted since. He has a history of suspected renal cell carcinoma with CT and February 2023 demonstrating abutment onto the fascia and right psoas muscle without invasion.  Given location of pain, I am concerned this may represent progression.  It is possible this is just musculoskeletal as well given distention with ascites.  - CT renal abdomen with and without contrast tomorrow to avoid nephrotoxicity given paracentesis and Lasix today - Dilaudid as needed for severe pain  Type 2 diabetes mellitus without complication, without long-term current use of insulin (HCC) - Hold home antiglycemic agents - SSI, moderate  Seropositive rheumatoid arthritis (HCC) Patient has a history of rheumatoid arthritis with intermittent pain in his hands and feet.  Not currently on any DMARDs.  He has established with rheumatology.  Renal cell carcinoma (HCC) History of suspected RCC first discovered in January 2023.  Patient has followed up with Dr. Michaelle Birks at [facility] neurology, however he was recommended to establish with a tertiary center due to complicated nature.   Discussed with urology; patient will  require repeat imaging for restaging giving a large mass is at risk for metastasis.  Depending on results from repeat imaging, will either need consultation with urology if it remains localized versus oncology if it has metastasized.  Given he has received Lasix today and a paracentesis we will schedule the CT for tomorrow to avoid nephrotoxicity.   Advance Care Planning:   Code Status: Full Code verified by patient  Consults: GI  Family Communication: No family at bedside  Severity of Illness: The appropriate patient status for this patient is OBSERVATION. Observation status is judged to be reasonable and necessary in order to provide the required intensity of service to ensure the patient's safety. The patient's presenting symptoms, physical exam findings, and initial radiographic and laboratory data in the context of their medical condition is felt to place them at decreased risk for further clinical deterioration. Furthermore, it is anticipated that the patient will be medically stable for discharge from the hospital within 2 midnights of admission.   Author: Verdene Lennert, MD 07/08/2022 4:25 PM  For on call review [email or web address].

## 2022-07-08 NOTE — ED Provider Notes (Signed)
[facility] Provider Note    Event Date/Time   First MD Initiated Contact with Patient 07/08/22 [phone]     (approximate)   History   Flank Pain   HPI  Cole Pierce is a 57 y.o. male  here with abdominal pain and distension, flank pain. Pt reports fairly acute onset of severe R sided flank pain over past 24 hours, with associated nausea, vomiting. He has had increasing abdominal distension for several weeks as well. He has felt swelling and worsening of his umbilical and R inguinal hernias over the same time period. He reports subjective chills. Pain is diffuse, severe, 10/10.        Physical Exam   Triage Vital Signs: ED Triage Vitals  Enc Vitals Group     BP 07/08/22 0611 (!) 155/69     Pulse Rate 07/08/22 0611 99     Resp 07/08/22 0611 18     Temp 07/08/22 0611 98.6 F (37 C)     Temp Source 07/08/22 0611 Oral     SpO2 07/08/22 0611 100 %     Weight 07/08/22 0603 176 lb (79.8 kg)     Height 07/08/22 0603 5\' 8"  (1.727 m)     Head Circumference --      Peak Flow --      Pain Score 07/08/22 0603 10     Pain Loc --      Pain Edu? --      Excl. in GC? --     Most recent vital signs: Vitals:   07/08/22 1133 07/08/22 1215  BP:  123/61  Pulse:  83  Resp:  14  Temp:    SpO2: 95%      General: Awake, no distress.  CV:  Good peripheral perfusion. RRR. Resp:  Normal work of breathing. DIminished breath sounds RLL and RML. Rales. Abd:  SIgnificant tight abdominal distension with large umbilical and R inguinal hernia. Other:  Trace LE edema.   ED Results / Procedures / Treatments   Labs (all labs ordered are listed, but only abnormal results are displayed) Labs Reviewed  CBC WITH DIFFERENTIAL/PLATELET - Abnormal; Notable for the following components:      Result Value   WBC 2.6 (*)    Hemoglobin 9.4 (*)    HCT 32.8 (*)    MCV 67.2 (*)    MCH 19.3 (*)    MCHC 28.7 (*)    RDW 18.6 (*)    Platelets 129 (*)    Lymphs Abs 0.4 (*)     All other components within normal limits  COMPREHENSIVE METABOLIC PANEL - Abnormal; Notable for the following components:   CO2 21 (*)    Glucose, Bld 161 (*)    Calcium 8.3 (*)    Albumin 3.0 (*)    Total Bilirubin 1.5 (*)    All other components within normal limits  PROTIME-INR - Abnormal; Notable for the following components:   Prothrombin Time 17.5 (*)    INR 1.4 (*)    All other components within normal limits  URINALYSIS, ROUTINE W REFLEX MICROSCOPIC - Abnormal; Notable for the following components:   Color, Urine STRAW (*)    APPearance CLEAR (*)    All other components within normal limits  BODY FLUID CELL COUNT WITH DIFFERENTIAL - Abnormal; Notable for the following components:   Color, Fluid YELLOW (*)    Appearance, Fluid CLEAR (*)    All other components within normal limits  LACTATE DEHYDROGENASE, PLEURAL  OR PERITONEAL FLUID - Abnormal; Notable for the following components:   LD, Fluid 63 (*)    All other components within normal limits  CULTURE, BLOOD (SINGLE)  BODY FLUID CULTURE W GRAM STAIN  PROTEIN, PLEURAL OR PERITONEAL FLUID  GLUCOSE, PLEURAL OR PERITONEAL FLUID  PATHOLOGIST SMEAR REVIEW  HEMOGLOBIN A1C     EKG    RADIOLOGY CT A/P: Large volume ascites, large umbilical and R indirect inguinal hernia with ascites   I also independently reviewed and agree with radiologist interpretations.   PROCEDURES:  Critical Care performed: No  ABDOMINAL PARACENTESIS  Date/Time: 07/08/2022 2:17 PM  Performed by: Shaune Pollack, MD Authorized by: Shaune Pollack, MD  Consent: Written consent obtained. Risks and benefits: risks, benefits and alternatives were discussed Consent given by: patient Patient understanding: patient states understanding of the procedure being performed Patient consent: the patient's understanding of the procedure matches consent given Procedure consent: procedure consent matches procedure scheduled Relevant documents: relevant  documents present and verified Test results: test results available and properly labeled Site marked: the operative site was marked Imaging studies: imaging studies available Required items: required blood products, implants, devices, and special equipment available Patient identity confirmed: arm band Time out: Immediately prior to procedure a "time out" was called to verify the correct patient, procedure, equipment, support staff and site/side marked as required. Preparation: Patient was prepped and draped in the usual sterile fashion. Local anesthesia used: yes Anesthesia: local infiltration  Anesthesia: Local anesthesia used: yes Local Anesthetic: lidocaine 1% with epinephrine Anesthetic total: 5 mL  Sedation: Patient sedated: no  Patient tolerance: patient tolerated the procedure well with no immediate complications Comments: Paracentesis performed, tolerated well. Full sterile technique used. Site marked with US guidance and [place] used during procedure to confirm intraperitoneal placement of catheter.       MEDICATIONS ORDERED IN ED: Medications  sodium chloride flush (NS) 0.9 % injection 3 mL (has no administration in time range)  acetaminophen (TYLENOL) tablet 650 mg (has no administration in time range)    Or  acetaminophen (TYLENOL) suppository 650 mg (has no administration in time range)  ondansetron (ZOFRAN) tablet 4 mg (has no administration in time range)    Or  ondansetron (ZOFRAN) injection 4 mg (has no administration in time range)  HYDROmorphone (DILAUDID) injection 0.5 mg (has no administration in time range)  polyethylene glycol (MIRALAX / GLYCOLAX) packet 17 g (has no administration in time range)  insulin aspart (novoLOG) injection 0-15 Units (has no administration in time range)  HYDROmorphone (DILAUDID) injection 1 mg (1 mg Intravenous Given 07/08/22 0817)  ondansetron (ZOFRAN) injection 4 mg (4 mg Intravenous Given 07/08/22 0816)  furosemide (LASIX)  injection 20 mg (20 mg Intravenous Given 07/08/22 0822)  fentaNYL (SUBLIMAZE) injection 50 mcg (50 mcg Intravenous Given 07/08/22 0948)  ondansetron (ZOFRAN) injection 4 mg (4 mg Intravenous Given 07/08/22 0948)  cefTRIAXone (ROCEPHIN) 2 g in sodium chloride 0.9 % 100 mL IVPB (0 g Intravenous Stopped 07/08/22 1203)  lidocaine-EPINEPHrine (XYLOCAINE W/EPI) 2 %-1:100000 (with pres) injection 20 mL (4 mLs Intradermal Given 07/08/22 1135)  fentaNYL (SUBLIMAZE) injection 50 mcg (50 mcg Intravenous Given 07/08/22 1120)  albumin human 25 % solution 12.5 g (0 g Intravenous Stopped 07/08/22 1309)     IMPRESSION / MDM / ASSESSMENT AND PLAN / ED COURSE  I reviewed the triage vital signs and the nursing notes.  Differential diagnosis includes, but is not limited to: incarcerated hernia, nephrolithiasis, pyelonephritis, obstruction, peritonitis, MSK pain, lumbar radiculopathy  Patient's presentation is most consistent with acute presentation with potential threat to life or bodily function.  The patient is on the cardiac monitor to evaluate for evidence of arrhythmia and/or significant heart rate changes  57 yo M with h/o RA, cirrhosis here with abd pain, back pain. Imaging and exam is concerning for SBP, versus tense abdominal ascites w/ pain from his two hernias. No signs of bowel obstruction. He is HDS. Given the need for diagnostic certainty and treatment, paracentesis performed w/ informed consent. 5L fluid removed, and sent for dx. Pt also has SOB and large R pleural effusion on CT imaging. Will admit for diuresis, tx for SBP/anasarca with tense ascites, further evaluation.    FINAL CLINICAL IMPRESSION(S) / ED DIAGNOSES   Final diagnoses:  Decompensated hepatic cirrhosis (HCC)  Other ascites     Rx / DC Orders   ED Discharge Orders     None        Note:  This document was prepared using Dragon voice recognition software and may include unintentional dictation  errors.   Shaune Pollack, MD 07/08/22 [phone]

## 2022-07-08 NOTE — Assessment & Plan Note (Signed)
-   Hold home antiglycemic agents - SSI, moderate 

## 2022-07-08 NOTE — ED Notes (Addendum)
Pt asking for pain medication at this time. States that his pain is 10/10. Explained that we would need to obtain blood work then I will try and get him seen after which. Pt verbalized understanding and allowed this RN to obtain bloodwork, unable to obtain urine at this time.  Lt green, lav, blue, red, and gray on ice sent to lab at this time.

## 2022-07-09 ENCOUNTER — Inpatient Hospital Stay: Payer: Medicaid Other

## 2022-07-09 ENCOUNTER — Observation Stay: Payer: Medicaid Other

## 2022-07-09 DIAGNOSIS — F17290 Nicotine dependence, other tobacco product, uncomplicated: Secondary | ICD-10-CM | POA: Diagnosis not present

## 2022-07-09 DIAGNOSIS — E43 Unspecified severe protein-calorie malnutrition: Secondary | ICD-10-CM | POA: Diagnosis not present

## 2022-07-09 DIAGNOSIS — R161 Splenomegaly, not elsewhere classified: Secondary | ICD-10-CM | POA: Diagnosis not present

## 2022-07-09 DIAGNOSIS — R935 Abnormal findings on diagnostic imaging of other abdominal regions, including retroperitoneum: Secondary | ICD-10-CM | POA: Diagnosis not present

## 2022-07-09 DIAGNOSIS — I8510 Secondary esophageal varices without bleeding: Secondary | ICD-10-CM | POA: Diagnosis not present

## 2022-07-09 DIAGNOSIS — K7290 Hepatic failure, unspecified without coma: Secondary | ICD-10-CM | POA: Diagnosis not present

## 2022-07-09 DIAGNOSIS — N1330 Unspecified hydronephrosis: Secondary | ICD-10-CM | POA: Diagnosis not present

## 2022-07-09 DIAGNOSIS — J90 Pleural effusion, not elsewhere classified: Secondary | ICD-10-CM | POA: Diagnosis not present

## 2022-07-09 DIAGNOSIS — K4090 Unilateral inguinal hernia, without obstruction or gangrene, not specified as recurrent: Secondary | ICD-10-CM | POA: Diagnosis not present

## 2022-07-09 DIAGNOSIS — K229 Disease of esophagus, unspecified: Secondary | ICD-10-CM | POA: Diagnosis not present

## 2022-07-09 DIAGNOSIS — F1011 Alcohol abuse, in remission: Secondary | ICD-10-CM | POA: Diagnosis not present

## 2022-07-09 DIAGNOSIS — D509 Iron deficiency anemia, unspecified: Secondary | ICD-10-CM | POA: Diagnosis not present

## 2022-07-09 DIAGNOSIS — J9811 Atelectasis: Secondary | ICD-10-CM | POA: Diagnosis not present

## 2022-07-09 DIAGNOSIS — Z79899 Other long term (current) drug therapy: Secondary | ICD-10-CM | POA: Diagnosis not present

## 2022-07-09 DIAGNOSIS — Z66 Do not resuscitate: Secondary | ICD-10-CM | POA: Diagnosis not present

## 2022-07-09 DIAGNOSIS — D6959 Other secondary thrombocytopenia: Secondary | ICD-10-CM | POA: Diagnosis not present

## 2022-07-09 DIAGNOSIS — E119 Type 2 diabetes mellitus without complications: Secondary | ICD-10-CM | POA: Diagnosis not present

## 2022-07-09 DIAGNOSIS — Z7984 Long term (current) use of oral hypoglycemic drugs: Secondary | ICD-10-CM | POA: Diagnosis not present

## 2022-07-09 DIAGNOSIS — M059 Rheumatoid arthritis with rheumatoid factor, unspecified: Secondary | ICD-10-CM | POA: Diagnosis not present

## 2022-07-09 DIAGNOSIS — R9389 Abnormal findings on diagnostic imaging of other specified body structures: Secondary | ICD-10-CM | POA: Diagnosis not present

## 2022-07-09 DIAGNOSIS — K766 Portal hypertension: Secondary | ICD-10-CM | POA: Diagnosis not present

## 2022-07-09 DIAGNOSIS — R188 Other ascites: Secondary | ICD-10-CM | POA: Diagnosis not present

## 2022-07-09 DIAGNOSIS — C641 Malignant neoplasm of right kidney, except renal pelvis: Secondary | ICD-10-CM | POA: Diagnosis not present

## 2022-07-09 DIAGNOSIS — K7469 Other cirrhosis of liver: Secondary | ICD-10-CM | POA: Diagnosis not present

## 2022-07-09 DIAGNOSIS — N179 Acute kidney failure, unspecified: Secondary | ICD-10-CM | POA: Diagnosis not present

## 2022-07-09 DIAGNOSIS — Z7189 Other specified counseling: Secondary | ICD-10-CM | POA: Diagnosis not present

## 2022-07-09 DIAGNOSIS — D61818 Other pancytopenia: Secondary | ICD-10-CM | POA: Diagnosis not present

## 2022-07-09 DIAGNOSIS — N2889 Other specified disorders of kidney and ureter: Secondary | ICD-10-CM | POA: Diagnosis not present

## 2022-07-09 DIAGNOSIS — Z515 Encounter for palliative care: Secondary | ICD-10-CM | POA: Diagnosis not present

## 2022-07-09 DIAGNOSIS — K8020 Calculus of gallbladder without cholecystitis without obstruction: Secondary | ICD-10-CM | POA: Diagnosis not present

## 2022-07-09 DIAGNOSIS — K7460 Unspecified cirrhosis of liver: Secondary | ICD-10-CM | POA: Diagnosis not present

## 2022-07-09 DIAGNOSIS — Z6825 Body mass index (BMI) 25.0-25.9, adult: Secondary | ICD-10-CM | POA: Diagnosis not present

## 2022-07-09 LAB — BASIC METABOLIC PANEL
Anion gap: 8 (ref 5–15)
BUN: 17 mg/dL (ref 6–20)
CO2: 23 mmol/L (ref 22–32)
Calcium: 8.1 mg/dL — ABNORMAL LOW (ref 8.9–10.3)
Chloride: 105 mmol/L (ref 98–111)
Creatinine, Ser: 1.63 mg/dL — ABNORMAL HIGH (ref 0.61–1.24)
GFR, Estimated: 49 mL/min — ABNORMAL LOW (ref >60)
Glucose, Bld: 138 mg/dL — ABNORMAL HIGH (ref 70–99)
Potassium: 3.5 mmol/L (ref 3.5–5.1)
Sodium: 136 mmol/L (ref 135–145)

## 2022-07-09 LAB — HEPATIC FUNCTION PANEL
ALT: 11 U/L (ref 0–44)
AST: 24 U/L (ref 15–41)
Albumin: 2.9 g/dL — ABNORMAL LOW (ref 3.5–5.0)
Alkaline Phosphatase: 65 U/L (ref 38–126)
Bilirubin, Direct: 0.2 mg/dL (ref 0.0–0.2)
Indirect Bilirubin: 1 mg/dL — ABNORMAL HIGH (ref 0.3–0.9)
Total Bilirubin: 1.2 mg/dL (ref 0.3–1.2)
Total Protein: 7.7 g/dL (ref 6.5–8.1)

## 2022-07-09 LAB — CBC WITH DIFFERENTIAL/PLATELET
Abs Immature Granulocytes: 0.01 10*3/uL (ref 0.00–0.07)
Basophils Absolute: 0 10*3/uL (ref 0.0–0.1)
Basophils Relative: 1 %
Eosinophils Absolute: 0.1 10*3/uL (ref 0.0–0.5)
Eosinophils Relative: 3 %
HCT: 32.8 % — ABNORMAL LOW (ref 39.0–52.0)
Hemoglobin: 9.6 g/dL — ABNORMAL LOW (ref 13.0–17.0)
Immature Granulocytes: 0 %
Lymphocytes Relative: 22 %
Lymphs Abs: 0.7 10*3/uL (ref 0.7–4.0)
MCH: 19.4 pg — ABNORMAL LOW (ref 26.0–34.0)
MCHC: 29.3 g/dL — ABNORMAL LOW (ref 30.0–36.0)
MCV: 66.3 fL — ABNORMAL LOW (ref 80.0–100.0)
Monocytes Absolute: 0.6 10*3/uL (ref 0.1–1.0)
Monocytes Relative: 18 %
Neutro Abs: 1.9 10*3/uL (ref 1.7–7.7)
Neutrophils Relative %: 56 %
Platelets: 124 10*3/uL — ABNORMAL LOW (ref 150–400)
RBC: 4.95 MIL/uL (ref 4.22–5.81)
RDW: 18.1 % — ABNORMAL HIGH (ref 11.5–15.5)
WBC: 3.4 10*3/uL — ABNORMAL LOW (ref 4.0–10.5)
nRBC: 0 % (ref 0.0–0.2)

## 2022-07-09 LAB — GLUCOSE, CAPILLARY
Glucose-Capillary: 109 mg/dL — ABNORMAL HIGH (ref 70–99)
Glucose-Capillary: 175 mg/dL — ABNORMAL HIGH (ref 70–99)
Glucose-Capillary: 154 mg/dL — ABNORMAL HIGH (ref 70–99)
Glucose-Capillary: 117 mg/dL — ABNORMAL HIGH (ref 70–99)

## 2022-07-09 LAB — PATHOLOGIST SMEAR REVIEW

## 2022-07-09 LAB — PROTIME-INR
INR: 1.4 — ABNORMAL HIGH (ref 0.8–1.2)
Prothrombin Time: 17.3 seconds — ABNORMAL HIGH (ref 11.4–15.2)

## 2022-07-09 MED ORDER — IOHEXOL 300 MG/ML  SOLN
100.0000 mL | Freq: Once | INTRAMUSCULAR | Status: AC | PRN
  Administered 2022-07-09: 100 mL via INTRAVENOUS

## 2022-07-09 MED ORDER — ACETYLCYSTEINE 20 % IN SOLN
600.0000 mg | Freq: Two times a day (BID) | RESPIRATORY_TRACT | Status: AC
  Administered 2022-07-10: 600 mg via ORAL
  Filled 2022-07-09: qty 4
  Filled 2022-07-09: qty 8

## 2022-07-09 MED ORDER — ALBUMIN HUMAN 25 % IV SOLN
12.5000 g | Freq: Four times a day (QID) | INTRAVENOUS | Status: AC
  Administered 2022-07-09 – 2022-07-11 (×8): 12.5 g via INTRAVENOUS
  Filled 2022-07-09 (×8): qty 50

## 2022-07-09 MED ORDER — ENSURE ENLIVE PO LIQD
237.0000 mL | Freq: Two times a day (BID) | ORAL | Status: DC
  Administered 2022-07-09 – 2022-07-11 (×5): 237 mL via ORAL

## 2022-07-09 NOTE — Progress Notes (Addendum)
Initial Nutrition Assessment  DOCUMENTATION CODES:   Severe malnutrition in context of chronic illness  INTERVENTION:   -Continue Ensure Enlive po BID, each supplement provides 350 kcal and 20 grams of protein -MVI with minerals daily -Liberalize diet to 2 gram sodium for wider variety of meal selections  NUTRITION DIAGNOSIS:   Severe Malnutrition related to chronic illness (cirrhosis) as evidenced by moderate fat depletion, severe fat depletion, moderate muscle depletion, severe muscle depletion.  GOAL:   Patient will meet greater than or equal to 90% of their needs  MONITOR:   PO intake, Supplement acceptance  REASON FOR ASSESSMENT:   Malnutrition Screening Tool    ASSESSMENT:   Pt with medical history significant of decompensated cirrhosis with ascites and esophageal varices of unknown etiology, type 2 diabetes, iron deficiency anemia, suspected renal cell carcinoma, rheumatoid arthritis not on DMARDs, who presents due to flank pain.  Pt admitted with decompensated hepatic cirrhosis with ascites and AKI likely related to large volume paracentesis.   Reviewed I/O's: -1.4 L x 24 hours  UOP: 1.6 L x 24 hours  Per MD notes, CT of abdomen and pelvis positive for renal cell carcinoma; per oncology, plan CT of chest, bone scan, and follow-up as outpatient.   Spoke with pt at bedside, who was pleasant and in good spirits today. He reports feeling better today in comparison to admission. Pt reports decreased oral intake related to early satiety and stomach pain. Pt reports stomach pain is inconsistent, but sometimes happens after eating foods and liquids. Pt also shares nausea and vomiting, which has improved today. PTA pt reports good appetite and although he had early satiety, he continued to eat small, frequent meals so that he could get nutrition in, as he knew that was important. Typically consumes 2-3 meals per day (Breakfast:  pancakes, eggs, and sausage; Lunch: sandwich;  Dinner: meat, starch, and vegetable). Pt has felt more weak lately; he reports he has been out of work due to two hernias. He has been creating wood furniture out of pallets, but finds this tiring.   Pt shares that his UBW is around 210#. He shares that he has been weighing around 175# over the past few months. He shares that he has lost 20# this hospitalization (which he attributes to paracentesis where he hsares 5 L were removed). Reviewed wt hx; pt has experienced a 10.7% wt loss over the past 11 months. While this is not significant for time frame, it is concerning given pt's malnutrition and multiple comorbidities.   Discussed importance of good meal and supplement intake to promote healing. Pt amenable to supplements. Discussed small, frequent meals to help with early satiety. Pt would be a poor candidate for alternative means of nutrition and hydration (ex PEG) secondary to history of cirrhosis with ascites.   Palliative care consult pending for goals of care.   Medications reviewed and include IV albumin.   Lab Results  Component Value Date   HGBA1C 8.6 (H) 07/03/2021   PTA DM medications are 5 mg glipizide daily and 5 mg farxiga daily.   Labs reviewed: CBGS: 175 (inpatient orders for glycemic control are 0-15 units insulin aspart TID with meals).    NUTRITION - FOCUSED PHYSICAL EXAM:  Flowsheet Row Most Recent Value  Orbital Region Severe depletion  Upper Arm Region Moderate depletion  Thoracic and Lumbar Region Severe depletion  Buccal Region Moderate depletion  Temple Region Severe depletion  Clavicle Bone Region Severe depletion  Clavicle and Acromion Bone Region Moderate  depletion  Scapular Bone Region Moderate depletion  Dorsal Hand Moderate depletion  Patellar Region Severe depletion  Anterior Thigh Region Severe depletion  Posterior Calf Region Severe depletion  Edema (RD Assessment) None  Hair Reviewed  Eyes Reviewed  Mouth Reviewed  Skin Reviewed  Nails Reviewed        Diet Order:   Diet Order             Diet heart healthy/carb modified Room service appropriate? Yes; Fluid consistency: Thin; Fluid restriction: 1500 mL Fluid  Diet effective now                   EDUCATION NEEDS:   Education needs have been addressed  Skin:  Skin Assessment: Reviewed RN Assessment  Last BM:  07/08/22  Height:   Ht Readings from Last 1 Encounters:  07/08/22 5\' 8"  (1.727 m)    Weight:   Wt Readings from Last 1 Encounters:  07/08/22 72.1 kg    Ideal Body Weight:  70 kg  BMI:  Body mass index is 24.18 kg/m.  Estimated Nutritional Needs:   Kcal:  2100-2300  Protein:  105-120 grams  Fluid:  1.5 L    Levada Schilling, RD, LDN, CDCES Registered Dietitian II Certified Diabetes Care and Education Specialist Please refer to [facility] for RD and/or RD on-call/weekend/after hours pager

## 2022-07-09 NOTE — Progress Notes (Signed)
[facility] Progress Note  Patient: Cole Pierce    WGN:[id number]  DOA: 07/08/2022     Date of Service: the patient was seen and examined on 07/09/2022  Chief Complaint  Patient presents with   Flank Pain   Brief hospital course: Cole Pierce is a 57 y.o. male with medical history significant of decompensated cirrhosis with ascites and esophageal varices of unknown etiology, type 2 diabetes, iron deficiency anemia, suspected renal cell carcinoma, rheumatoid arthritis not on DMARDs, who presents to the ED due to flank pain. severe right flank and back pain that started around midnight and has persisted since.   ED workup: BP 155/69, rest stable.  BMP BG 161, total bili 1.5.  CBC WBC 2.6, hemoglobin 9.4, platelet 129 CT stone: 1. Cirrhosis with large volume ascites and new moderate to large right pleural effusion. There is a large right inguinal hernia distended by ascitic fluid. 2. Known solid right renal mass, presumed renal cell carcinoma. 3. Cholelithiasis.  [facility] was consulted for admission and further management as below.  Assessment and Plan:  # Decompensated hepatic cirrhosis with Ascites S/p paracentesis 5 L fluid was tapped in the ED CT A/p: Cirrhotic liver morphology with sequela of portal hypertension including splenomegaly, large volume abdominal ascites, and abdominal varices. Fluid studies not consistent with SBP, fluid culture NGTD Blood culture NGTD S/p Lasix and Aldactone, discontinued due to AKI Started albumin IV infusion GI consulted   # AKI most likely due to large volume paracentesis and diuretic use Creatinine 1.63 Discontinued Lasix and Aldactone Started albumin IV infusion Renal functions and urine output Avoid nephrotoxic medications, use renally dose medications Started Mucomyst 600 mg p.o. twice daily for 2 days to prevent CIN   # Renal cell carcinoma, right kidney with possible local metastasis Patient presented with right flank pain Continue  as needed medication for pain control CT A/P: Enhancing heterogeneous right lower pole renal lesion is slightly increased in size when compared with February 21, 2021 CT and consistent with renal cell carcinoma. Increased abutment the right psoas muscle with no definite intervening plane, local invasion can not be excluded. Moderate right hydronephrosis, likely due to obstruction of the renal pelvis secondary external compression from renal mass. 7/8 discussed with oncologist Dr. Smith Robert, recommended CT chest without contrast and bone scan and follow-up as an outpatient. Urology consulted Palliative care consulted for goals of care discussion  # Right pleural effusion, could be due to underlying malignancy. currently patient is not in respiratory distress we will continue to monitor Patient may need thoracentesis before discharge Follow CT chest  # Pancytopenia due to liver cirrhosis and splenomegaly No bleeding at this time Monitor CBC  # NIDDM T2 Held home medications for now Continue NovoLog sliding scale, monitor CBG Continue diabetic diet  Seropositive rheumatoid arthritis (HCC) Patient has a history of rheumatoid arthritis with intermittent pain in his hands and feet.  Not currently on any DMARDs.  He has established with rheumatology.   Body mass index is 24.18 kg/m.  Interventions:   Diet: Diabetic diet DVT Prophylaxis: SCD, pharmacological prophylaxis contraindicated due to risk of bleeding    Advance goals of care discussion: Full code  Family Communication: family was not present at bedside, at the time of interview.  The pt provided permission to discuss medical plan with the family. Opportunity was given to ask question and all questions were answered satisfactorily.   Disposition:  Pt is from Home, admitted with right renal cell carcinoma, liver cirrhosis with ascites,  developed AKI, still has renal mass and AKI, which precludes a safe discharge. Discharge to Home,  when stable, may need few days to stay.  Subjective: No significant events overnight, patient still complaining of pain in the right flank area 8/10, it stopped after pain medication.  Denies any chest pain or palpitation, no shortness of breath, no any other complaints.  Physical Exam: General: NAD, lying comfortably Appear in no distress, affect appropriate Eyes: PERRLA ENT: Oral Mucosa Clear, moist  Neck: no JVD,  Cardiovascular: S1 and S2 Present, no Murmur,  Respiratory: Good air entry bilaterally, decreased breath sounds on the right lower chest.  No wheezing.   Abdomen: Bowel Sound present, Soft, right flank and upper quadrant tenderness.  Umbilical hernia Skin: no rashes Extremities: no Pedal edema, no calf tenderness Neurologic: without any new focal findings Gait not checked due to patient safety concerns  Vitals:   07/08/22 2104 07/09/22 0037 07/09/22 0542 07/09/22 0749  BP: 138/78 128/82 122/79 117/79  Pulse: 89 92 77 73  Resp: 18 18 18    Temp: 98.5 F (36.9 C) 99.1 F (37.3 C) 98.2 F (36.8 C) 98.1 F (36.7 C)  TempSrc: Oral Oral Oral Oral  SpO2: 95% 96% 98% 97%  Weight:      Height:        Intake/Output Summary (Last 24 hours) at 07/09/2022 1416 Last data filed at 07/09/2022 1028 Gross per 24 hour  Intake 960 ml  Output 300 ml  Net 660 ml   Filed Weights   07/08/22 0603 07/08/22 1641  Weight: 79.8 kg 72.1 kg    Data Reviewed: I have personally reviewed and interpreted daily labs, tele strips, imagings as discussed above. I reviewed all nursing notes, pharmacy notes, vitals, pertinent old records I have discussed plan of care as described above with RN and patient/family.  CBC: Recent Labs  Lab 07/08/22 0739 07/09/22 0508  WBC 2.6* 3.4*  NEUTROABS 1.7 1.9  HGB 9.4* 9.6*  HCT 32.8* 32.8*  MCV 67.2* 66.3*  PLT 129* 124*   Basic Metabolic Panel: Recent Labs  Lab 07/08/22 0739 07/09/22 0508  NA 137 136  K 3.8 3.5  CL 108 105  CO2 21* 23   GLUCOSE 161* 138*  BUN 16 17  CREATININE 0.97 1.63*  CALCIUM 8.3* 8.1*  MG 2.1  --     Studies: CT RENAL ABD W/WO  Result Date: 07/09/2022 CLINICAL DATA:  RCC; * Tracking Code: BO * EXAM: CT ABDOMEN WITHOUT AND WITH CONTRAST TECHNIQUE: Multidetector CT imaging of the abdomen was performed following the standard protocol before and following the bolus administration of intravenous contrast. RADIATION DOSE REDUCTION: This exam was performed according to the departmental dose-optimization program which includes automated exposure control, adjustment of the mA and/or kV according to patient size and/or use of iterative reconstruction technique. CONTRAST:  OMNIPAQUE IOHEXOL 300 MG/ML  SOLN COMPARISON:  CT of the abdomen and pelvis dated July 08, 2022; contrast-enhanced CT abdomen dated February 25, 2021 FINDINGS: Lower chest: Small right pleural effusion bilateral lower lobe atelectasis. Small hiatal hernia and paraesophageal varices. Hepatobiliary: Cirrhotic liver morphology. No suspicious liver lesions. Numerous gallstones. No biliary ductal dilation. Pancreas: Unremarkable. No pancreatic ductal dilatation or surrounding inflammatory changes. Spleen: Splenomegaly, measuring up to 15.5 cm. Adrenals/Urinary Tract: Bilateral adrenal glands are unremarkable. Heterogeneous enhancing lesion of the anterior lower pole of the right kidney measuring 5.8 x 4.5 cm, previously 5.4 x 4.3 cm on prior 23 abdomen CT. Moderate right hydronephrosis, unchanged when  compared with the prior and likely due to obstruction of the renal pelvis secondary external compression from renal mass. Extension into Gerota's fascia and abutment the right psoas muscle with no definite intervening plane, local invasion can not be excluded (for example, series 6, image 110 and series 9, image 93). No evidence of renal vein invasion. Left kidney is unremarkable. Stomach/Bowel: Stomach is unremarkable. Visualized small and large bowel  unremarkable. Vascular/Lymphatic: Normal caliber thoracic aorta with mild atherosclerotic disease. Patent portal vein. Numerous abdominal varices. No enlarged lymph nodes seen in the abdomen. Other: Large volume abdominal ascites. Partially visualized ventral abdominal wall hernia containing fluid. Musculoskeletal: No acute or significant osseous findings. IMPRESSION: 1. Enhancing heterogeneous right lower pole renal lesion is slightly increased in size when compared with February 21, 2021 CT and consistent with renal cell carcinoma. Increased abutment the right psoas muscle with no definite intervening plane, local invasion can not be excluded. 2. Moderate right hydronephrosis, likely due to obstruction of the renal pelvis secondary external compression from renal mass. 3. Cirrhotic liver morphology with sequela of portal hypertension including splenomegaly, large volume abdominal ascites, and abdominal varices. 4. Small right pleural effusion. 5. Cholelithiasis. 6. Aortic Atherosclerosis (ICD10-I70.0). Electronically Signed   By: Allegra Lai M.D.   On: 07/09/2022 10:37    Scheduled Meds:  acetylcysteine  600 mg Oral BID   feeding supplement  237 mL Oral BID BM   insulin aspart  0-15 Units Subcutaneous TID WC   lidocaine  1 patch Transdermal Q24H   sodium chloride flush  3 mL Intravenous Q12H   Continuous Infusions:  albumin human 12.5 g (07/09/22 1224)   PRN Meds: acetaminophen **OR** acetaminophen, cyclobenzaprine, HYDROmorphone (DILAUDID) injection, ondansetron **OR** ondansetron (ZOFRAN) IV, polyethylene glycol  Time spent: 55 minutes  Author: Gillis Santa. MD [facility] 07/09/2022 2:16 PM  To reach On-call, see care teams to locate the attending and reach out to them via [email or web address]. If 7PM-7AM, please contact night-coverage If you still have difficulty reaching the attending provider, please page the [facility] (Director on Call) for [facility] on amion for assistance.

## 2022-07-09 NOTE — Progress Notes (Signed)
GI Inpatient Follow-up Note  Subjective:  Patient seen in follow-up for cirrhosis of the liver with ascites. No acute events overnight. No overt GI bleeding. He reports abdominal pain/flank pain is improved significantly s/p LVP. Hemoglobin 9.6 this morning. No new complaints. Main questions this morning are about his hernias.   Scheduled Inpatient Medications:   feeding supplement  237 mL Oral BID BM   furosemide  40 mg Oral Daily   insulin aspart  0-15 Units Subcutaneous TID WC   lidocaine  1 patch Transdermal Q24H   sodium chloride flush  3 mL Intravenous Q12H   spironolactone  100 mg Oral Daily    PRN Inpatient Medications:  acetaminophen **OR** acetaminophen, cyclobenzaprine, HYDROmorphone (DILAUDID) injection, ondansetron **OR** ondansetron (ZOFRAN) IV, polyethylene glycol  Review of Systems: Constitutional: Weight is stable.  Eyes: No changes in vision. ENT: No oral lesions, sore throat.  GI: see HPI.  Heme/Lymph: No easy bruising.  CV: No chest pain.  GU: No hematuria.  Integumentary: No rashes.  Neuro: No headaches.  Psych: No depression/anxiety.  Endocrine: No heat/cold intolerance.  Allergic/Immunologic: No urticaria.  Resp: No cough, SOB.  Musculoskeletal: No joint swelling.    Physical Examination: BP 117/79 (BP Location: Right Arm)   Pulse 73   Temp 98.1 F (36.7 C) (Oral)   Resp 18   Ht 5\' 8"  (1.727 m)   Wt 72.1 kg   SpO2 97%   BMI 24.18 kg/m  Gen: NAD, alert and oriented x 4 HEENT: PEERLA, EOMI, Neck: supple, no JVD or thyromegaly Chest: CTA bilaterally, no wheezes, crackles, or other adventitious sounds CV: RRR, no m/g/c/r Abd: soft, NT, ND, +BS in all four quadrants; no HSM, guarding, ridigity, or rebound tenderness Ext: no edema, well perfused with 2+ pulses, Skin: no rash or lesions noted Lymph: no LAD  Data: Lab Results  Component Value Date   WBC 3.4 (L) 07/09/2022   HGB 9.6 (L) 07/09/2022   HCT 32.8 (L) 07/09/2022   MCV 66.3 (L)  07/09/2022   PLT 124 (L) 07/09/2022   Recent Labs  Lab 07/08/22 0739 07/09/22 0508  HGB 9.4* 9.6*   Lab Results  Component Value Date   NA 136 07/09/2022   K 3.5 07/09/2022   CL 105 07/09/2022   CO2 23 07/09/2022   BUN 17 07/09/2022   CREATININE 1.63 (H) 07/09/2022   Lab Results  Component Value Date   ALT 16 07/08/2022   AST 27 07/08/2022   ALKPHOS 78 07/08/2022   BILITOT 1.5 (H) 07/08/2022   Recent Labs  Lab 07/09/22 0508  INR 1.4*    Assessment/Plan:  57 y/o Caucasian male with a PMH of T2DM, IDA, suspected RCC, rheumatoid arthritis, and cryptogenic cirrhosis presented to the [facility] ED 7/7 for chief complaint of right flank and back pain with abdominal distention. GI consulted for further evaluation and management of abdominal ascites and cirrhosis.   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  - Continue diuretics with Lasix 40 mg PO daily and Spironolactone 100 mg daily - Continue strict low-sodium diet not to exceed 2000 mg/day -  Monitor electrolytes/renal function closely with addition of diuretics  - Avoid NSAIDs. Tylenol OK not to exceed 2000 mg/day.  - Avoid hepatotoxins  - Continue to monitor daily weights, strict I/O - Follow-up results of CT renal abdomen w/wo contrast today - He will benefit from outpatient follow-up in our clinic for ongoing cirrhosis management and HCC surveillance q6 months with labs and imaging  - Strict abstinence from alcohol encouraged  - GI following along with you for now   Please call with questions or concerns.  Jacob Moores,  PA-C [facility] [phone]

## 2022-07-10 ENCOUNTER — Other Ambulatory Visit: Payer: Self-pay | Admitting: Physician Assistant

## 2022-07-10 ENCOUNTER — Inpatient Hospital Stay: Payer: Medicaid Other

## 2022-07-10 ENCOUNTER — Encounter: Payer: Self-pay | Admitting: Student

## 2022-07-10 DIAGNOSIS — K7460 Unspecified cirrhosis of liver: Secondary | ICD-10-CM | POA: Diagnosis not present

## 2022-07-10 DIAGNOSIS — N2889 Other specified disorders of kidney and ureter: Secondary | ICD-10-CM

## 2022-07-10 DIAGNOSIS — R109 Unspecified abdominal pain: Secondary | ICD-10-CM

## 2022-07-10 DIAGNOSIS — R188 Other ascites: Secondary | ICD-10-CM

## 2022-07-10 DIAGNOSIS — E43 Unspecified severe protein-calorie malnutrition: Secondary | ICD-10-CM | POA: Insufficient documentation

## 2022-07-10 DIAGNOSIS — R935 Abnormal findings on diagnostic imaging of other abdominal regions, including retroperitoneum: Secondary | ICD-10-CM | POA: Diagnosis not present

## 2022-07-10 DIAGNOSIS — K766 Portal hypertension: Secondary | ICD-10-CM | POA: Diagnosis not present

## 2022-07-10 DIAGNOSIS — K7290 Hepatic failure, unspecified without coma: Secondary | ICD-10-CM | POA: Diagnosis not present

## 2022-07-10 LAB — BASIC METABOLIC PANEL
Anion gap: 8 (ref 5–15)
BUN: 21 mg/dL — ABNORMAL HIGH (ref 6–20)
CO2: 25 mmol/L (ref 22–32)
Calcium: 8.1 mg/dL — ABNORMAL LOW (ref 8.9–10.3)
Chloride: 99 mmol/L (ref 98–111)
Creatinine, Ser: 1.13 mg/dL (ref 0.61–1.24)
GFR, Estimated: >60 mL/min (ref >60)
Glucose, Bld: 126 mg/dL — ABNORMAL HIGH (ref 70–99)
Potassium: 3.7 mmol/L (ref 3.5–5.1)
Sodium: 132 mmol/L — ABNORMAL LOW (ref 135–145)

## 2022-07-10 LAB — GLUCOSE, CAPILLARY
Glucose-Capillary: 109 mg/dL — ABNORMAL HIGH (ref 70–99)
Glucose-Capillary: 167 mg/dL — ABNORMAL HIGH (ref 70–99)
Glucose-Capillary: 135 mg/dL — ABNORMAL HIGH (ref 70–99)

## 2022-07-10 LAB — CBC
HCT: 29.6 % — ABNORMAL LOW (ref 39.0–52.0)
Hemoglobin: 8.7 g/dL — ABNORMAL LOW (ref 13.0–17.0)
MCH: 19.2 pg — ABNORMAL LOW (ref 26.0–34.0)
MCHC: 29.4 g/dL — ABNORMAL LOW (ref 30.0–36.0)
MCV: 65.2 fL — ABNORMAL LOW (ref 80.0–100.0)
Platelets: 112 10*3/uL — ABNORMAL LOW (ref 150–400)
RBC: 4.54 MIL/uL (ref 4.22–5.81)
RDW: 18 % — ABNORMAL HIGH (ref 11.5–15.5)
WBC: 2.6 10*3/uL — ABNORMAL LOW (ref 4.0–10.5)
nRBC: 0 % (ref 0.0–0.2)

## 2022-07-10 LAB — HEMOGLOBIN A1C
Hgb A1c MFr Bld: 6.1 % — ABNORMAL HIGH (ref 4.8–5.6)
Mean Plasma Glucose: 128 mg/dL

## 2022-07-10 LAB — BODY FLUID CULTURE W GRAM STAIN

## 2022-07-10 LAB — AMMONIA: Ammonia: 57 umol/L — ABNORMAL HIGH (ref 9–35)

## 2022-07-10 LAB — PHOSPHORUS: Phosphorus: 4.4 mg/dL (ref 2.5–4.6)

## 2022-07-10 LAB — MAGNESIUM: Magnesium: 2.1 mg/dL (ref 1.7–2.4)

## 2022-07-10 MED ORDER — SPIRONOLACTONE 25 MG PO TABS
50.0000 mg | ORAL_TABLET | Freq: Every day | ORAL | Status: DC
  Administered 2022-07-10 – 2022-07-11 (×2): 50 mg via ORAL
  Filled 2022-07-10 (×2): qty 2

## 2022-07-10 MED ORDER — TECHNETIUM TC 99M MEDRONATE IV KIT
20.0000 | PACK | Freq: Once | INTRAVENOUS | Status: AC | PRN
  Administered 2022-07-10: 20.54 via INTRAVENOUS

## 2022-07-10 MED ORDER — FUROSEMIDE 40 MG PO TABS
40.0000 mg | ORAL_TABLET | Freq: Every day | ORAL | Status: DC
  Administered 2022-07-10 – 2022-07-11 (×2): 40 mg via ORAL
  Filled 2022-07-10 (×2): qty 1

## 2022-07-10 MED ORDER — PANTOPRAZOLE SODIUM 40 MG PO TBEC
40.0000 mg | DELAYED_RELEASE_TABLET | Freq: Two times a day (BID) | ORAL | Status: DC
  Administered 2022-07-10 – 2022-07-11 (×3): 40 mg via ORAL
  Filled 2022-07-10 (×3): qty 1

## 2022-07-10 MED ORDER — SODIUM CHLORIDE 0.9 % IV SOLN: INTRAVENOUS | Status: AC

## 2022-07-10 NOTE — Consult Note (Signed)
Urology Consult  I have been asked to see the patient by Dr. Lucianne Muss, for evaluation and management of right RCC.  Chief Complaint: Right flank, back pain  History of Present Illness: Cole Pierce is a 57 y.o. year old male with decompensated cirrhosis with ascites and esophageal varices, DM 2, iron deficiency anemia, and known right renal mass admitted on 07/08/2022 with right flank and back pain.  CT stone study revealed cirrhosis with large volume ascites, new moderate to large right pleural effusion, large right inguinal hernia, redemonstrated known right renal mass, and fullness of the right renal pelvis.  Follow-up CT abdomen with and without contrast yesterday further evaluated renal mass, which showed slight interval increased size, now 5.8 x 4.5 cm, with increased abutment of the right psoas muscle and moderate right hydronephrosis believed secondary to compression of the proximal ureter.  Hydronephrosis does appear slightly increased over prior study.  Since arrival, he has had paracentesis with removal of 5 L of ascites.  Oncology has been consulted.  CT chest without contrast showed interval decrease in size of the right pleural effusion without evidence of metastasis.  Bone scan pending.  Creatinine down from 1.63 today, now 1.13, baseline ~1.0.  He was previously seen by Dr. Richardo Hanks in our clinic to discuss management of his renal mass and was recommended to follow-up at a tertiary care center.  He was referred to [facility], but was never seen by them.  Today, patient reports that he did not go to that appointment because he was having abdominal pain at the time.  He states his pain is improved today following paracentesis.  Past Medical History:  Diagnosis Date   Arthritis    Ascites    Cancer (HCC)    Cirrhosis (HCC)    Diabetes mellitus without complication (HCC)    Hernia, hiatal    IDA (iron deficiency anemia)    Inguinal hernia    right   Portal venous hypertension (HCC)     Renal cell carcinoma (HCC)    Splenomegaly    Thrombocytopenia (HCC)     Past Surgical History:  Procedure Laterality Date   COLONOSCOPY N/A 06/19/2021   Procedure: COLONOSCOPY;  Surgeon: Regis Bill, MD;  Location: [facility] ENDOSCOPY;  Service: Endoscopy;  Laterality: N/A;   ESOPHAGOGASTRODUODENOSCOPY N/A 06/19/2021   Procedure: ESOPHAGOGASTRODUODENOSCOPY (EGD);  Surgeon: Regis Bill, MD;  Location: [facility] ENDOSCOPY;  Service: Endoscopy;  Laterality: N/A;   FRACTURE SURGERY      Home Medications:  Current Meds  Medication Sig   predniSONE (DELTASONE) 5 MG tablet Take 5 mg by mouth daily as needed.    Allergies:  Allergies  Allergen Reactions   Zantac [Ranitidine Hcl] Anaphylaxis   Penicillins Hives    Hives in mouth    History reviewed. No pertinent family history.  Social History:  reports that he has been smoking cigars. He has never used smokeless tobacco. He reports that he does not currently use alcohol. He reports that he does not use drugs.  ROS: A complete review of systems was performed.  All systems are negative except for pertinent findings as noted.  Physical Exam:  Vital signs in last 24 hours: Temp:  [97.4 F (36.3 C)-99 F (37.2 C)] 98.8 F (37.1 C) (07/09 0809) Pulse Rate:  [73-89] 73 (07/09 0809) Resp:  [16-18] 16 (07/09 0809) BP: (120-155)/(75-87) 120/75 (07/09 0809) SpO2:  [95 %-100 %] 96 % (07/09 0809) Weight:  [74.7 kg] 74.7 kg (07/09 0447) Constitutional:  Alert  and oriented, no acute distress HEENT: [place] AT, moist mucus membranes Cardiovascular: No clubbing, cyanosis, or edema Respiratory: Normal respiratory effort Skin: No rashes, bruises or suspicious lesions Lymph: No cervical or inguinal adenopathy Neurologic: Grossly intact, no focal deficits, moving all 4 extremities Psychiatric: Normal mood and affect   Laboratory Data:  Recent Labs    07/08/22 0739 07/09/22 0508 07/10/22 0618  WBC 2.6* 3.4* 2.6*  HGB 9.4* 9.6*  8.7*  HCT 32.8* 32.8* 29.6*   Recent Labs    07/08/22 0739 07/09/22 0508 07/10/22 0450  NA 137 136 132*  K 3.8 3.5 3.7  CL 108 105 99  CO2 21* 23 25  GLUCOSE 161* 138* 126*  BUN 16 17 21*  CREATININE 0.97 1.63* 1.13  CALCIUM 8.3* 8.1* 8.1*   Recent Labs    07/08/22 0810 07/09/22 0508  INR 1.4* 1.4*   Urinalysis    Component Value Date/Time   COLORURINE STRAW (A) 07/08/2022 0739   APPEARANCEUR CLEAR (A) 07/08/2022 0739   APPEARANCEUR Clear 02/23/2021 1455   LABSPEC 1.008 07/08/2022 0739   PHURINE 7.0 07/08/2022 0739   GLUCOSEU NEGATIVE 07/08/2022 0739   HGBUR NEGATIVE 07/08/2022 0739   BILIRUBINUR NEGATIVE 07/08/2022 0739   BILIRUBINUR Negative 02/23/2021 1455   KETONESUR NEGATIVE 07/08/2022 0739   PROTEINUR NEGATIVE 07/08/2022 0739   NITRITE NEGATIVE 07/08/2022 0739   LEUKOCYTESUR NEGATIVE 07/08/2022 0739   Results for orders placed or performed during the hospital encounter of 07/08/22  Blood culture (single)     Status: None (Preliminary result)   Collection Time: 07/08/22 10:34 AM   Specimen: BLOOD  Result Value Ref Range Status   Specimen Description BLOOD RIGHT WRIST  Final   Special Requests   Final    BOTTLES DRAWN AEROBIC AND ANAEROBIC Blood Culture adequate volume   Culture   Final    NO GROWTH 2 DAYS Performed at [facility], [address] [place], [place] [address]    Report Status PENDING  Incomplete  Body fluid culture w Gram Stain     Status: None (Preliminary result)   Collection Time: 07/08/22 12:27 PM   Specimen: Peritoneal Washings; Body Fluid  Result Value Ref Range Status   Specimen Description   Final    PERITONEAL Performed at [facility], [address] [place], [place] [address]    Special Requests   Final    Immunocompromised Performed at [facility], [address] [place], [place] [address]    Gram Stain   Final    RARE WBC PRESENT, PREDOMINANTLY MONONUCLEAR NO ORGANISMS SEEN     Culture   Final    NO GROWTH < 24 HOURS Performed at [facility], [address], [place], [place] [address]    Report Status PENDING  Incomplete    Radiologic Imaging: CT CHEST WO CONTRAST  Result Date: 07/09/2022 CLINICAL DATA:  Occult malignancy EXAM: CT CHEST WITHOUT CONTRAST TECHNIQUE: Multidetector CT imaging of the chest was performed following the standard protocol without IV contrast. RADIATION DOSE REDUCTION: This exam was performed according to the departmental dose-optimization program which includes automated exposure control, adjustment of the mA and/or kV according to patient size and/or use of iterative reconstruction technique. COMPARISON:  CT abdomen 07/09/2022. FINDINGS: Cardiovascular: No significant vascular findings. Normal heart size. No pericardial effusion. Mediastinum/Nodes: There is diffuse wall thickening of the esophagus. The visualized thyroid gland is within normal limits. No definite enlarged mediastinal, hilar or axillary lymph nodes are seen. Lungs/Pleura: There  is a small to moderate-sized right pleural effusion. There is compressive atelectasis of the right lower lobe. The lungs are otherwise clear. There is no pneumothorax. Trachea and central airways are patent. Upper Abdomen: Again seen is liver cirrhosis, ascites and splenomegaly. Gallstones are again noted. Residual contrast is seen within the right kidney which may be related to medical renal disease or obstruction. Musculoskeletal: No chest wall mass or suspicious bone lesions identified. IMPRESSION: 1. Small to moderate-sized right pleural effusion with compressive atelectasis of the right lower lobe. 2. Diffuse wall thickening of the esophagus may be related to esophagitis. 3. Liver cirrhosis, ascites and splenomegaly. 4. Cholelithiasis. 5. Residual contrast in the right kidney may be related to medical renal disease or obstruction. Electronically Signed   By: Darliss Cheney M.D.   On: 07/09/2022 19:27    CT RENAL ABD W/WO  Result Date: 07/09/2022 CLINICAL DATA:  RCC; * Tracking Code: BO * EXAM: CT ABDOMEN WITHOUT AND WITH CONTRAST TECHNIQUE: Multidetector CT imaging of the abdomen was performed following the standard protocol before and following the bolus administration of intravenous contrast. RADIATION DOSE REDUCTION: This exam was performed according to the departmental dose-optimization program which includes automated exposure control, adjustment of the mA and/or kV according to patient size and/or use of iterative reconstruction technique. CONTRAST:  OMNIPAQUE IOHEXOL 300 MG/ML  SOLN COMPARISON:  CT of the abdomen and pelvis dated July 08, 2022; contrast-enhanced CT abdomen dated February 25, 2021 FINDINGS: Lower chest: Small right pleural effusion bilateral lower lobe atelectasis. Small hiatal hernia and paraesophageal varices. Hepatobiliary: Cirrhotic liver morphology. No suspicious liver lesions. Numerous gallstones. No biliary ductal dilation. Pancreas: Unremarkable. No pancreatic ductal dilatation or surrounding inflammatory changes. Spleen: Splenomegaly, measuring up to 15.5 cm. Adrenals/Urinary Tract: Bilateral adrenal glands are unremarkable. Heterogeneous enhancing lesion of the anterior lower pole of the right kidney measuring 5.8 x 4.5 cm, previously 5.4 x 4.3 cm on prior 23 abdomen CT. Moderate right hydronephrosis, unchanged when compared with the prior and likely due to obstruction of the renal pelvis secondary external compression from renal mass. Extension into Gerota's fascia and abutment the right psoas muscle with no definite intervening plane, local invasion can not be excluded (for example, series 6, image 110 and series 9, image 93). No evidence of renal vein invasion. Left kidney is unremarkable. Stomach/Bowel: Stomach is unremarkable. Visualized small and large bowel unremarkable. Vascular/Lymphatic: Normal caliber thoracic aorta with mild atherosclerotic disease. Patent  portal vein. Numerous abdominal varices. No enlarged lymph nodes seen in the abdomen. Other: Large volume abdominal ascites. Partially visualized ventral abdominal wall hernia containing fluid. Musculoskeletal: No acute or significant osseous findings. IMPRESSION: 1. Enhancing heterogeneous right lower pole renal lesion is slightly increased in size when compared with February 21, 2021 CT and consistent with renal cell carcinoma. Increased abutment the right psoas muscle with no definite intervening plane, local invasion can not be excluded. 2. Moderate right hydronephrosis, likely due to obstruction of the renal pelvis secondary external compression from renal mass. 3. Cirrhotic liver morphology with sequela of portal hypertension including splenomegaly, large volume abdominal ascites, and abdominal varices. 4. Small right pleural effusion. 5. Cholelithiasis. 6. Aortic Atherosclerosis (ICD10-I70.0). Electronically Signed   By: Allegra Lai M.D.   On: 07/09/2022 10:37   DG Chest 2 View  Result Date: 07/08/2022 CLINICAL DATA:  Pleural effusion. EXAM: CHEST - 2 VIEW COMPARISON:  11/11/2007.  Current abdomen and pelvis CT FINDINGS: Cardiac silhouette is normal in size. No mediastinal or hilar masses. No  evidence of adenopathy. Elevated right hemidiaphragm. Small right pleural effusion, that appears larger on the current CT. No left pleural effusion. Mild dependent opacity at the right lung base consistent with atelectasis, stable from the current CT. Remainder of the lungs is clear. No pneumothorax. Skeletal structures are intact. IMPRESSION: 1. Small right pleural effusion, which appears larger on the current CT of the abdomen and pelvis. Mild associated dependent right lower lobe atelectasis. 2. No acute cardiopulmonary disease. Electronically Signed   By: Amie Portland M.D.   On: 07/08/2022 12:54    Assessment & Plan:  57 year old male with decompensated cirrhosis with ascites and esophageal varices and  known right renal mass concerning for RCC admitted with right abdominal and flank pain in the setting of large volume ascites, now s/p paracentesis with removal of 5 L of fluid.  With downtrending renal function, I do not think urgent intervention for his right hydronephrosis is warranted at this time.  I think his pain is more likely secondary to large volume ascites given improvement following paracentesis as well as renal mass rather than hydronephrosis.  Agree with oncology involvement.  Please see prior recommendations from Dr. Richardo Hanks.  Agree with prior recommendations, that in the setting of his decompensated cirrhosis and poor surgical candidacy, would recommend referral back to a tertiary care center.  In the meantime, agree with palliative care involvement to define scope of treatment.  Thank you for involving me in this patient's care, please page with any further questions or concerns.  Carman Ching, PA-C 07/10/2022 9:12 AM

## 2022-07-10 NOTE — Progress Notes (Signed)
[facility] Progress Note  Patient: Cole Pierce    WUJ:[id number]  DOA: 07/08/2022     Date of Service: the patient was seen and examined on 07/10/2022  Chief Complaint  Patient presents with   Flank Pain   Brief hospital course: Cole Pierce is a 57 y.o. male with medical history significant of decompensated cirrhosis with ascites and esophageal varices of unknown etiology, type 2 diabetes, iron deficiency anemia, suspected renal cell carcinoma, rheumatoid arthritis not on DMARDs, who presents to the ED due to flank pain. severe right flank and back pain that started around midnight and has persisted since.   ED workup: BP 155/69, rest stable.  BMP BG 161, total bili 1.5.  CBC WBC 2.6, hemoglobin 9.4, platelet 129 CT stone: 1. Cirrhosis with large volume ascites and new moderate to large right pleural effusion. There is a large right inguinal hernia distended by ascitic fluid. 2. Known solid right renal mass, presumed renal cell carcinoma. 3. Cholelithiasis.  [facility] was consulted for admission and further management as below.  Assessment and Plan:  # Decompensated hepatic cirrhosis with Ascites S/p paracentesis 5 L fluid was tapped in the ED CT A/p: Cirrhotic liver morphology with sequela of portal hypertension including splenomegaly, large volume abdominal ascites, and abdominal varices. Fluid studies not consistent with SBP, fluid culture NGTD Blood culture NGTD 7/8 S/p Lasix and Aldactone, discontinued due to AKI Started albumin IV infusion GI consulted 7/9-renal functions improved, started Lasix 40 mg and Aldactone 50 mg p.o. Pierce   # AKI most likely due to large volume paracentesis and diuretic use Creatinine 1.63--1.23 AKI resolved, s/p IV albumin  7/8 Discontinued Lasix and Aldactone 7/9 resumed Lasix and Aldactone Renal functions and urine output Avoid nephrotoxic medications, use renally dose medications S/p Mucomyst 600 mg p.o. twice Pierce for 2 days to prevent  CIN   # Renal cell carcinoma, right kidney with possible local metastasis Patient presented with right flank pain Continue as needed medication for pain control CT A/P: Enhancing heterogeneous right lower pole renal lesion is slightly increased in size when compared with February 21, 2021 CT and consistent with renal cell carcinoma. Increased abutment the right psoas muscle with no definite intervening plane, local invasion can not be excluded. Moderate right hydronephrosis, likely due to obstruction of the renal pelvis secondary external compression from renal mass. 7/8 discussed with oncologist Dr. Smith Robert, recommended CT chest without contrast and bone scan and follow-up as an outpatient. CT Chest: Small to moderate-sized right pleural effusion with compressive atelectasis of the right lower lobe. Diffuse wall thickening of the esophagus may be related to esophagitis. Liver cirrhosis, ascites and splenomegaly. Cholelithiasis. Residual contrast in the right kidney may be related to medical renal disease or obstruction. Bone scan: No scintigraphic evidence of osseous metastatic disease. Asymmetrically increased radiotracer uptake within the RIGHT kidney which can be seen in the setting of obstruction. Urology consulted, recommended to follow with a urologist at [facility] as patient was referred in the past but never followed. Palliative care consulted for goals of care discussion   # Right pleural effusion, could be due to underlying malignancy. currently patient is not in respiratory distress we will continue to monitor Patient may need thoracentesis before discharge CT chest shows small to moderate right-sided pleural effusion and atelectasis.  No metastasis.  # Pancytopenia due to liver cirrhosis and splenomegaly No bleeding at this time Monitor CBC  # NIDDM T2 Held home medications for now Continue NovoLog sliding scale, monitor CBG Continue  diabetic diet  Seropositive rheumatoid arthritis  (HCC) Patient has a history of rheumatoid arthritis with intermittent pain in his hands and feet.  Not currently on any DMARDs.  He has established with rheumatology.   Body mass index is 25.04 kg/m.  Interventions:   Diet: Diabetic diet DVT Prophylaxis: SCD, pharmacological prophylaxis contraindicated due to risk of bleeding    Advance goals of care discussion: Full code  Family Communication: family was not present at bedside, at the time of interview.  The pt provided permission to discuss medical plan with the family. Opportunity was given to ask question and all questions were answered satisfactorily.   Disposition:  Pt is from Home, admitted with right renal cell carcinoma, liver cirrhosis with ascites, developed AKI, still has renal mass and severe pain, which precludes a safe discharge. Discharge to Home, when stable, most likely tomorrow a.m.  Subjective: No significant events overnight, patient was seen after bone scan, stated that his back pain is 9/10 which is because of the skin, denies any shortness of breath, no chest pain, no any other complaints.   Physical Exam: General: NAD, lying comfortably Appear in no distress, affect appropriate Eyes: PERRLA ENT: Oral Mucosa Clear, moist  Neck: no JVD,  Cardiovascular: S1 and S2 Present, no Murmur,  Respiratory: Good air entry bilaterally, decreased breath sounds on the right lower chest.  No wheezing.   Abdomen: Bowel Sound present, Soft, right flank and right upper quadrant tenderness.  Umbilical hernia Skin: no rashes Extremities: no Pedal edema, no calf tenderness Neurologic: without any new focal findings Gait not checked due to patient safety concerns  Vitals:   07/09/22 1548 07/10/22 0406 07/10/22 0447 07/10/22 0809  BP: (!) 155/87 124/75  120/75  Pulse: 89 84  73  Resp: 18 18  16   Temp: (!) 97.4 F (36.3 C) 99 F (37.2 C)  98.8 F (37.1 C)  TempSrc: Oral Oral  Oral  SpO2: 100% 95%  96%  Weight:   74.7  kg   Height:        Intake/Output Summary (Last 24 hours) at 07/10/2022 1514 Last data filed at 07/10/2022 1403 Gross per 24 hour  Intake --  Output 950 ml  Net -950 ml   Filed Weights   07/08/22 0603 07/08/22 1641 07/10/22 0447  Weight: 79.8 kg 72.1 kg 74.7 kg    Data Reviewed: I have personally reviewed and interpreted Pierce labs, tele strips, imagings as discussed above. I reviewed all nursing notes, pharmacy notes, vitals, pertinent old records I have discussed plan of care as described above with RN and patient/family.  CBC: Recent Labs  Lab 07/08/22 0739 07/09/22 0508 07/10/22 0618  WBC 2.6* 3.4* 2.6*  NEUTROABS 1.7 1.9  --   HGB 9.4* 9.6* 8.7*  HCT 32.8* 32.8* 29.6*  MCV 67.2* 66.3* 65.2*  PLT 129* 124* 112*   Basic Metabolic Panel: Recent Labs  Lab 07/08/22 0739 07/09/22 0508 07/10/22 0450  NA 137 136 132*  K 3.8 3.5 3.7  CL 108 105 99  CO2 21* 23 25  GLUCOSE 161* 138* 126*  BUN 16 17 21*  CREATININE 0.97 1.63* 1.13  CALCIUM 8.3* 8.1* 8.1*  MG 2.1  --  2.1  PHOS  --   --  4.4    Studies: NM Bone Scan Whole Body  Result Date: 07/10/2022 CLINICAL DATA:  Occult malignancy. History of a RIGHT renal mass consistent with renal cell carcinoma. EXAM: NUCLEAR MEDICINE WHOLE BODY BONE SCAN TECHNIQUE: Whole body  anterior and posterior images were obtained approximately 3 hours after intravenous injection of radiopharmaceutical. RADIOPHARMACEUTICALS:  20.54 mCi Technetium-56m MDP IV COMPARISON:  CT dated July 09, 2022 a July 08, 2022 FINDINGS: No suspicious focal radiotracer uptake. There is radiotracer uptake within the sternoclavicular joints and bilateral shoulders This is favored to be degenerative in etiology. Expected physiologic distribution of radiotracer. There is asymmetric radiotracer uptake within the RIGHT kidney which could be due to obstructive physiology. IMPRESSION: 1.  No scintigraphic evidence of osseous metastatic disease. 2. Asymmetrically increased  radiotracer uptake within the RIGHT kidney which can be seen in the setting of obstruction. Electronically Signed   By: Meda Klinefelter M.D.   On: 07/10/2022 13:47   CT CHEST WO CONTRAST  Result Date: 07/09/2022 CLINICAL DATA:  Occult malignancy EXAM: CT CHEST WITHOUT CONTRAST TECHNIQUE: Multidetector CT imaging of the chest was performed following the standard protocol without IV contrast. RADIATION DOSE REDUCTION: This exam was performed according to the departmental dose-optimization program which includes automated exposure control, adjustment of the mA and/or kV according to patient size and/or use of iterative reconstruction technique. COMPARISON:  CT abdomen 07/09/2022. FINDINGS: Cardiovascular: No significant vascular findings. Normal heart size. No pericardial effusion. Mediastinum/Nodes: There is diffuse wall thickening of the esophagus. The visualized thyroid gland is within normal limits. No definite enlarged mediastinal, hilar or axillary lymph nodes are seen. Lungs/Pleura: There is a small to moderate-sized right pleural effusion. There is compressive atelectasis of the right lower lobe. The lungs are otherwise clear. There is no pneumothorax. Trachea and central airways are patent. Upper Abdomen: Again seen is liver cirrhosis, ascites and splenomegaly. Gallstones are again noted. Residual contrast is seen within the right kidney which may be related to medical renal disease or obstruction. Musculoskeletal: No chest wall mass or suspicious bone lesions identified. IMPRESSION: 1. Small to moderate-sized right pleural effusion with compressive atelectasis of the right lower lobe. 2. Diffuse wall thickening of the esophagus may be related to esophagitis. 3. Liver cirrhosis, ascites and splenomegaly. 4. Cholelithiasis. 5. Residual contrast in the right kidney may be related to medical renal disease or obstruction. Electronically Signed   By: Darliss Cheney M.D.   On: 07/09/2022 19:27    Scheduled  Meds:  acetylcysteine  600 mg Oral BID   feeding supplement  237 mL Oral BID BM   furosemide  40 mg Oral Pierce   insulin aspart  0-15 Units Subcutaneous TID WC   lidocaine  1 patch Transdermal Q24H   pantoprazole  40 mg Oral BID   sodium chloride flush  3 mL Intravenous Q12H   spironolactone  50 mg Oral Pierce   Continuous Infusions:  albumin human 12.5 g (07/10/22 0850)   PRN Meds: acetaminophen **OR** acetaminophen, cyclobenzaprine, HYDROmorphone (DILAUDID) injection, ondansetron **OR** ondansetron (ZOFRAN) IV, polyethylene glycol  Time spent: 40 minutes  Author: Gillis Santa. MD [facility] 07/10/2022 3:14 PM  To reach On-call, see care teams to locate the attending and reach out to them via [email or web address]. If 7PM-7AM, please contact night-coverage If you still have difficulty reaching the attending provider, please page the [facility] (Director on Call) for [facility] on amion for assistance.

## 2022-07-10 NOTE — Progress Notes (Signed)
  Chaplain On-Call received a page at 0858 hours from patient's RN Alvino Chapel, who asked questions about HCPOA process related to the patient's out-of-state brother.  Chaplain provided education and support for Alvino Chapel about the documents, and about the patient's process for completion in hospital if he chooses.  Chaplain Evelena Peat M.Div., [facility]

## 2022-07-10 NOTE — Progress Notes (Signed)
GI Inpatient Follow-up Note  Subjective:  Patient seen in follow-up for cirrhosis of the liver with ascites. No acute events overnight. No overt GI bleeding. Hemoglobin 8.7 this morning. He did have PET scan performed this morning.   Scheduled Inpatient Medications:   acetylcysteine  600 mg Oral BID   feeding supplement  237 mL Oral BID BM   furosemide  40 mg Oral Daily   insulin aspart  0-15 Units Subcutaneous TID WC   lidocaine  1 patch Transdermal Q24H   pantoprazole  40 mg Oral BID   sodium chloride flush  3 mL Intravenous Q12H   spironolactone  50 mg Oral Daily    PRN Inpatient Medications:  acetaminophen **OR** acetaminophen, cyclobenzaprine, HYDROmorphone (DILAUDID) injection, ondansetron **OR** ondansetron (ZOFRAN) IV, polyethylene glycol  Review of Systems: Constitutional: Weight is stable.  Eyes: No changes in vision. ENT: No oral lesions, sore throat.  GI: see HPI.  Heme/Lymph: No easy bruising.  CV: No chest pain.  GU: No hematuria.  Integumentary: No rashes.  Neuro: No headaches.  Psych: No depression/anxiety.  Endocrine: No heat/cold intolerance.  Allergic/Immunologic: No urticaria.  Resp: No cough, SOB.  Musculoskeletal: No joint swelling.    Physical Examination: BP 120/75 (BP Location: Right Arm)   Pulse 73   Temp 98.8 F (37.1 C) (Oral)   Resp 16   Ht 5\' 8"  (1.727 m)   Wt 74.7 kg   SpO2 96%   BMI 25.04 kg/m  Gen: NAD, alert and oriented x 4 HEENT: PEERLA, EOMI, Neck: supple, no JVD or thyromegaly Chest: CTA bilaterally, no wheezes, crackles, or other adventitious sounds CV: RRR, no m/g/c/r Abd: soft, NT, ND, +BS in all four quadrants; no HSM, guarding, ridigity, or rebound tenderness Ext: no edema, well perfused with 2+ pulses, Skin: no rash or lesions noted Lymph: no LAD  Data: Lab Results  Component Value Date   WBC 2.6 (L) 07/10/2022   HGB 8.7 (L) 07/10/2022   HCT 29.6 (L) 07/10/2022   MCV 65.2 (L) 07/10/2022   PLT 112 (L)  07/10/2022   Recent Labs  Lab 07/08/22 0739 07/09/22 0508 07/10/22 0618  HGB 9.4* 9.6* 8.7*   Lab Results  Component Value Date   NA 132 (L) 07/10/2022   K 3.7 07/10/2022   CL 99 07/10/2022   CO2 25 07/10/2022   BUN 21 (H) 07/10/2022   CREATININE 1.13 07/10/2022   Lab Results  Component Value Date   ALT 11 07/09/2022   AST 24 07/09/2022   ALKPHOS 65 07/09/2022   BILITOT 1.2 07/09/2022   Recent Labs  Lab 07/09/22 0508  INR 1.4*   CT renal abd w/wo contrast 07/09/2022: IMPRESSION: 1. Enhancing heterogeneous right lower pole renal lesion is slightly increased in size when compared with February 21, 2021 CT and consistent with renal cell carcinoma. Increased abutment the right psoas muscle with no definite intervening plane, local invasion can not be excluded. 2. Moderate right hydronephrosis, likely due to obstruction of the renal pelvis secondary external compression from renal mass. 3. Cirrhotic liver morphology with sequela of portal hypertension including splenomegaly, large volume abdominal ascites, and abdominal varices. 4. Small right pleural effusion. 5. Cholelithiasis. 6. Aortic Atherosclerosis (ICD10-I70.0).  Assessment/Plan:  57 y/o Caucasian male with a PMH of T2DM, IDA, suspected RCC, rheumatoid arthritis, and cryptogenic cirrhosis presented to the [facility] ED 7/7 for chief complaint of right flank and back pain with abdominal distention. GI consulted for further evaluation and management of abdominal ascites and cirrhosis.  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  -  Continue diuretics with Lasix 40 mg PO daily and Spironolactone 50 mg daily - Continue strict low-sodium diet not to exceed 2000 mg/day - Monitor electrolytes/renal function closely with addition of diuretics  - Avoid NSAIDs. Tylenol OK not to exceed 2000 mg/day.  - Avoid hepatotoxins  - Continue to monitor daily weights, strict I/O - He will benefit from outpatient follow-up in our clinic for ongoing cirrhosis management and HCC surveillance q6 months with labs and imaging  - No further GI interventions recommended at this time - Appreciate urology recommendations for RCC. They are recommending tertiary care consult.  - Appreciate palliative care recs to update goals of care - GI will sign off at this time and follow peripherally  Please call with questions or concerns.  Jacob Moores, PA-C [facility] [phone]

## 2022-07-11 ENCOUNTER — Telehealth: Payer: Self-pay | Admitting: Oncology

## 2022-07-11 DIAGNOSIS — K7290 Hepatic failure, unspecified without coma: Secondary | ICD-10-CM | POA: Diagnosis not present

## 2022-07-11 DIAGNOSIS — Z7189 Other specified counseling: Secondary | ICD-10-CM

## 2022-07-11 DIAGNOSIS — Z515 Encounter for palliative care: Secondary | ICD-10-CM | POA: Diagnosis not present

## 2022-07-11 DIAGNOSIS — C641 Malignant neoplasm of right kidney, except renal pelvis: Secondary | ICD-10-CM | POA: Diagnosis not present

## 2022-07-11 DIAGNOSIS — K7460 Unspecified cirrhosis of liver: Secondary | ICD-10-CM | POA: Diagnosis not present

## 2022-07-11 DIAGNOSIS — Z66 Do not resuscitate: Secondary | ICD-10-CM

## 2022-07-11 LAB — CBC
HCT: 31.3 % — ABNORMAL LOW (ref 39.0–52.0)
Hemoglobin: 9.1 g/dL — ABNORMAL LOW (ref 13.0–17.0)
MCH: 18.8 pg — ABNORMAL LOW (ref 26.0–34.0)
MCHC: 29.1 g/dL — ABNORMAL LOW (ref 30.0–36.0)
MCV: 64.8 fL — ABNORMAL LOW (ref 80.0–100.0)
Platelets: 120 10*3/uL — ABNORMAL LOW (ref 150–400)
RBC: 4.83 MIL/uL (ref 4.22–5.81)
RDW: 17.8 % — ABNORMAL HIGH (ref 11.5–15.5)
WBC: 2.5 10*3/uL — ABNORMAL LOW (ref 4.0–10.5)
nRBC: 0 % (ref 0.0–0.2)

## 2022-07-11 LAB — BASIC METABOLIC PANEL
Anion gap: 9 (ref 5–15)
BUN: 20 mg/dL (ref 6–20)
CO2: 27 mmol/L (ref 22–32)
Calcium: 8.3 mg/dL — ABNORMAL LOW (ref 8.9–10.3)
Chloride: 99 mmol/L (ref 98–111)
Creatinine, Ser: 1.08 mg/dL (ref 0.61–1.24)
GFR, Estimated: >60 mL/min (ref >60)
Glucose, Bld: 102 mg/dL — ABNORMAL HIGH (ref 70–99)
Potassium: 3.5 mmol/L (ref 3.5–5.1)
Sodium: 135 mmol/L (ref 135–145)

## 2022-07-11 LAB — GLUCOSE, CAPILLARY
Glucose-Capillary: 119 mg/dL — ABNORMAL HIGH (ref 70–99)
Glucose-Capillary: 203 mg/dL — ABNORMAL HIGH (ref 70–99)

## 2022-07-11 LAB — PHOSPHORUS: Phosphorus: 4.6 mg/dL (ref 2.5–4.6)

## 2022-07-11 LAB — MAGNESIUM: Magnesium: 2 mg/dL (ref 1.7–2.4)

## 2022-07-11 MED ORDER — OXYCODONE HCL 5 MG PO TABS: 5.0000 mg | ORAL_TABLET | Freq: Three times a day (TID) | ORAL | 0 refills | Status: AC | PRN

## 2022-07-11 MED ORDER — FUROSEMIDE 40 MG PO TABS: 40.0000 mg | ORAL_TABLET | Freq: Every day | ORAL | 2 refills | Status: DC

## 2022-07-11 MED ORDER — ACETAMINOPHEN 325 MG PO TABS: 650.0000 mg | ORAL_TABLET | Freq: Four times a day (QID) | ORAL | Status: DC | PRN

## 2022-07-11 MED ORDER — CYCLOBENZAPRINE HCL 5 MG PO TABS: 5.0000 mg | ORAL_TABLET | Freq: Two times a day (BID) | ORAL | 0 refills | Status: DC | PRN

## 2022-07-11 MED ORDER — LACTULOSE 10 G PO PACK: 10.0000 g | PACK | Freq: Three times a day (TID) | ORAL | 0 refills | Status: AC | PRN

## 2022-07-11 MED ORDER — PANTOPRAZOLE SODIUM 40 MG PO TBEC: 40.0000 mg | DELAYED_RELEASE_TABLET | Freq: Two times a day (BID) | ORAL | 0 refills | Status: AC

## 2022-07-11 MED ORDER — SPIRONOLACTONE 50 MG PO TABS: 50.0000 mg | ORAL_TABLET | Freq: Every day | ORAL | 2 refills | Status: AC

## 2022-07-11 MED ORDER — OXYCODONE HCL 5 MG PO TABS
5.0000 mg | ORAL_TABLET | ORAL | Status: DC | PRN
  Administered 2022-07-11: 5 mg via ORAL
  Filled 2022-07-11: qty 1

## 2022-07-11 NOTE — Consult Note (Signed)
Hematology/Oncology Consult note [facility] [phone] [phone]  Patient Care Team: Berniece Salines, FNP as PCP - General (Nurse Practitioner)   Name of the patient: Cole Pierce  [id number]  [birth year]    Reason for consult: Concern for renal cell carcinoma   Requesting physician: Dr. Gillis Santa   Date of visit: 07/11/2022    History of presenting illness-patient is a 57 year old male with a past medical history significant for cirrhosis, ascites and portal hypertension who was admitted with symptoms of back pain and flank pain.  CT renal stone study showed cirrhosis with large volume ascites as well as moderate to large right pleural effusion.  He was also found to have right renal mass measuring about 5.8 cm with abutment of the right psoas muscle and moderate right hydronephrosis.  This study was compared to his prior study from last year and findings were more or less stable.  GI was consulted and he had paracentesis with removal of 5 L of fluid.  CT chest and bone scan did not show any evidence of distant metastatic disease.  ECOG PS- 3  Pain scale- 3   Review of systems- Review of Systems  Constitutional:  Positive for malaise/fatigue. Negative for chills, fever and weight loss.  HENT:  Negative for congestion, ear discharge and nosebleeds.   Eyes:  Negative for blurred vision.  Respiratory:  Negative for cough, hemoptysis, sputum production, shortness of breath and wheezing.   Cardiovascular:  Negative for chest pain, palpitations, orthopnea and claudication.  Gastrointestinal:  Negative for abdominal pain, blood in stool, constipation, diarrhea, heartburn, melena, nausea and vomiting.  Genitourinary:  Negative for dysuria, flank pain, frequency, hematuria and urgency.  Musculoskeletal:  Positive for back pain. Negative for joint pain and myalgias.  Skin:  Negative for rash.  Neurological:  Negative for dizziness, tingling,  focal weakness, seizures, weakness and headaches.  Endo/Heme/Allergies:  Does not bruise/bleed easily.  Psychiatric/Behavioral:  Negative for depression and suicidal ideas. The patient does not have insomnia.     Allergies  Allergen Reactions   Zantac [Ranitidine Hcl] Anaphylaxis   Penicillins Hives    Hives in mouth    Patient Active Problem List   Diagnosis Date Noted   Protein-calorie malnutrition, severe 07/10/2022   Decompensated hepatic cirrhosis (HCC) 07/08/2022   Pancytopenia (HCC) 07/08/2022   Renal cell carcinoma (HCC) 07/08/2022   Right low back pain 07/08/2022   Right inguinal hernia 08/22/2021   Calculus of gallbladder without cholecystitis without obstruction 08/22/2021   Type 2 diabetes mellitus without complication, without long-term current use of insulin (HCC) 01/19/2021   Cirrhosis of liver with ascites (HCC) 01/19/2021   Abdominal fluid collection 01/19/2021   Iron deficiency anemia 07/07/2020   Thrombocytopenia (HCC) 07/07/2020   Umbilical hernia 07/07/2020   Seropositive rheumatoid arthritis (HCC) 03/25/2020     Past Medical History:  Diagnosis Date   Arthritis    Ascites    Cancer (HCC)    Cirrhosis (HCC)    Diabetes mellitus without complication (HCC)    Hernia, hiatal    IDA (iron deficiency anemia)    Inguinal hernia    right   Portal venous hypertension (HCC)    Renal cell carcinoma (HCC)    Splenomegaly    Thrombocytopenia (HCC)      Past Surgical History:  Procedure Laterality Date   COLONOSCOPY N/A 06/19/2021   Procedure: COLONOSCOPY;  Surgeon: Regis Bill, MD;  Location: [facility] ENDOSCOPY;  Service: Endoscopy;  Laterality: N/A;  ESOPHAGOGASTRODUODENOSCOPY N/A 06/19/2021   Procedure: ESOPHAGOGASTRODUODENOSCOPY (EGD);  Surgeon: Regis Bill, MD;  Location: [facility] ENDOSCOPY;  Service: Endoscopy;  Laterality: N/A;   FRACTURE SURGERY      Social History   Socioeconomic History   Marital status: Single    Spouse name: Not  on file   Number of children: 3   Years of education: Not on file   Highest education level: Not on file  Occupational History   Not on file  Tobacco Use   Smoking status: Some Days    Types: Cigars   Smokeless tobacco: Never  Vaping Use   Vaping Use: Never used  Substance and Sexual Activity   Alcohol use: Not Currently    Comment: had quit for 13 years and recently started back   Drug use: Never   Sexual activity: Yes    Birth control/protection: None  Other Topics Concern   Not on file  Social History Narrative   ** Merged History Encounter **       Social Determinants of Health   Financial Resource Strain: Not on file  Food Insecurity: No Food Insecurity (07/08/2022)   Hunger Vital Sign    Worried About Running Out of Food in the Last Year: Never true    Ran Out of Food in the Last Year: Never true  Transportation Needs: No Transportation Needs (07/08/2022)   PRAPARE - Administrator, Civil Service (Medical): No    Lack of Transportation (Non-Medical): No  Physical Activity: Not on file  Stress: Not on file  Social Connections: Not on file  Intimate Partner Violence: Not At Risk (07/08/2022)   Humiliation, Afraid, Rape, and Kick questionnaire    Fear of Current or Ex-Partner: No    Emotionally Abused: No    Physically Abused: No    Sexually Abused: No     History reviewed. No pertinent family history.   Current Facility-Administered Medications:    acetaminophen (TYLENOL) tablet 650 mg, 650 mg, Oral, Q6H PRN, 650 mg at 07/09/22 0509 **OR** acetaminophen (TYLENOL) suppository 650 mg, 650 mg, Rectal, Q6H PRN, Verdene Lennert, MD   acetylcysteine (MUCOMYST) 20 % nebulizer / oral solution 600 mg, 600 mg, Oral, BID, Gillis Santa, MD, 600 mg at 07/10/22 2259   cyclobenzaprine (FLEXERIL) tablet 5 mg, 5 mg, Oral, BID PRN, Verdene Lennert, MD, 5 mg at 07/10/22 2339   feeding supplement (ENSURE ENLIVE / ENSURE PLUS) liquid 237 mL, 237 mL, Oral, BID BM, Verdene Lennert, MD, 237 mL at 07/11/22 0809   furosemide (LASIX) tablet 40 mg, 40 mg, Oral, Daily, Gillis Santa, MD, 40 mg at 07/11/22 0807   HYDROmorphone (DILAUDID) injection 0.5 mg, 0.5 mg, Intravenous, Q3H PRN, Verdene Lennert, MD, 0.5 mg at 07/11/22 0438   insulin aspart (novoLOG) injection 0-15 Units, 0-15 Units, Subcutaneous, TID WC, Verdene Lennert, MD, 3 Units at 07/09/22 1723   lidocaine (LIDODERM) 5 % 1 patch, 1 patch, Transdermal, Q24H, Verdene Lennert, MD, 1 patch at 07/10/22 2009   ondansetron (ZOFRAN) tablet 4 mg, 4 mg, Oral, Q6H PRN **OR** ondansetron (ZOFRAN) injection 4 mg, 4 mg, Intravenous, Q6H PRN, Verdene Lennert, MD, 4 mg at 07/11/22 0807   pantoprazole (PROTONIX) EC tablet 40 mg, 40 mg, Oral, BID, Gillis Santa, MD, 40 mg at 07/11/22 0807   polyethylene glycol (MIRALAX / GLYCOLAX) packet 17 g, 17 g, Oral, Daily PRN, Verdene Lennert, MD   sodium chloride flush (NS) 0.9 % injection 3 mL, 3 mL, Intravenous, Q12H, Verdene Lennert,  MD, 3 mL at 07/10/22 2302   spironolactone (ALDACTONE) tablet 50 mg, 50 mg, Oral, Daily, Gillis Santa, MD, 50 mg at 07/11/22 0807   Physical exam:  Vitals:   07/10/22 0809 07/10/22 1543 07/10/22 2009 07/11/22 0502  BP: 120/75 119/71 129/77 111/64  Pulse: 73 77 88 79  Resp: 16 16 18 18   Temp: 98.8 F (37.1 C) 98.4 F (36.9 C) 98.9 F (37.2 C) 98.5 F (36.9 C)  TempSrc: Oral Oral Oral Oral  SpO2: 96% 97% 96% 94%  Weight:      Height:       Physical Exam Cardiovascular:     Rate and Rhythm: Normal rate and regular rhythm.     Heart sounds: Normal heart sounds.  Pulmonary:     Effort: Pulmonary effort is normal.     Breath sounds: Normal breath sounds.  Abdominal:     General: Bowel sounds are normal.     Palpations: Abdomen is soft.     Comments: Soft nondistended  Skin:    General: Skin is warm and dry.  Neurological:     Mental Status: He is alert and oriented to person, place, and time.           Latest Ref Rng & Units 07/11/2022     6:23 AM  CMP  Glucose 70 - 99 mg/dL 161   BUN 6 - 20 mg/dL 20   Creatinine 0.96 - 1.24 mg/dL 0.45   Sodium 409 - 811 mmol/L 135   Potassium 3.5 - 5.1 mmol/L 3.5   Chloride 98 - 111 mmol/L 99   CO2 22 - 32 mmol/L 27   Calcium 8.9 - 10.3 mg/dL 8.3       Latest Ref Rng & Units 07/11/2022    6:23 AM  CBC  WBC 4.0 - 10.5 K/uL 2.5   Hemoglobin 13.0 - 17.0 g/dL 9.1   Hematocrit 91.4 - 52.0 % 31.3   Platelets 150 - 400 K/uL 120     @IMAGES @  NM Bone Scan Whole Body  Result Date: 07/10/2022 CLINICAL DATA:  Occult malignancy. History of a RIGHT renal mass consistent with renal cell carcinoma. EXAM: NUCLEAR MEDICINE WHOLE BODY BONE SCAN TECHNIQUE: Whole body anterior and posterior images were obtained approximately 3 hours after intravenous injection of radiopharmaceutical. RADIOPHARMACEUTICALS:  20.54 mCi Technetium-3m MDP IV COMPARISON:  CT dated July 09, 2022 a July 08, 2022 FINDINGS: No suspicious focal radiotracer uptake. There is radiotracer uptake within the sternoclavicular joints and bilateral shoulders This is favored to be degenerative in etiology. Expected physiologic distribution of radiotracer. There is asymmetric radiotracer uptake within the RIGHT kidney which could be due to obstructive physiology. IMPRESSION: 1.  No scintigraphic evidence of osseous metastatic disease. 2. Asymmetrically increased radiotracer uptake within the RIGHT kidney which can be seen in the setting of obstruction. Electronically Signed   By: Meda Klinefelter M.D.   On: 07/10/2022 13:47   CT CHEST WO CONTRAST  Result Date: 07/09/2022 CLINICAL DATA:  Occult malignancy EXAM: CT CHEST WITHOUT CONTRAST TECHNIQUE: Multidetector CT imaging of the chest was performed following the standard protocol without IV contrast. RADIATION DOSE REDUCTION: This exam was performed according to the departmental dose-optimization program which includes automated exposure control, adjustment of the mA and/or kV according to  patient size and/or use of iterative reconstruction technique. COMPARISON:  CT abdomen 07/09/2022. FINDINGS: Cardiovascular: No significant vascular findings. Normal heart size. No pericardial effusion. Mediastinum/Nodes: There is diffuse wall thickening of the esophagus. The visualized thyroid  gland is within normal limits. No definite enlarged mediastinal, hilar or axillary lymph nodes are seen. Lungs/Pleura: There is a small to moderate-sized right pleural effusion. There is compressive atelectasis of the right lower lobe. The lungs are otherwise clear. There is no pneumothorax. Trachea and central airways are patent. Upper Abdomen: Again seen is liver cirrhosis, ascites and splenomegaly. Gallstones are again noted. Residual contrast is seen within the right kidney which may be related to medical renal disease or obstruction. Musculoskeletal: No chest wall mass or suspicious bone lesions identified. IMPRESSION: 1. Small to moderate-sized right pleural effusion with compressive atelectasis of the right lower lobe. 2. Diffuse wall thickening of the esophagus may be related to esophagitis. 3. Liver cirrhosis, ascites and splenomegaly. 4. Cholelithiasis. 5. Residual contrast in the right kidney may be related to medical renal disease or obstruction. Electronically Signed   By: Darliss Cheney M.D.   On: 07/09/2022 19:27   CT RENAL ABD W/WO  Result Date: 07/09/2022 CLINICAL DATA:  RCC; * Tracking Code: BO * EXAM: CT ABDOMEN WITHOUT AND WITH CONTRAST TECHNIQUE: Multidetector CT imaging of the abdomen was performed following the standard protocol before and following the bolus administration of intravenous contrast. RADIATION DOSE REDUCTION: This exam was performed according to the departmental dose-optimization program which includes automated exposure control, adjustment of the mA and/or kV according to patient size and/or use of iterative reconstruction technique. CONTRAST:  OMNIPAQUE IOHEXOL 300 MG/ML  SOLN  COMPARISON:  CT of the abdomen and pelvis dated July 08, 2022; contrast-enhanced CT abdomen dated February 25, 2021 FINDINGS: Lower chest: Small right pleural effusion bilateral lower lobe atelectasis. Small hiatal hernia and paraesophageal varices. Hepatobiliary: Cirrhotic liver morphology. No suspicious liver lesions. Numerous gallstones. No biliary ductal dilation. Pancreas: Unremarkable. No pancreatic ductal dilatation or surrounding inflammatory changes. Spleen: Splenomegaly, measuring up to 15.5 cm. Adrenals/Urinary Tract: Bilateral adrenal glands are unremarkable. Heterogeneous enhancing lesion of the anterior lower pole of the right kidney measuring 5.8 x 4.5 cm, previously 5.4 x 4.3 cm on prior 23 abdomen CT. Moderate right hydronephrosis, unchanged when compared with the prior and likely due to obstruction of the renal pelvis secondary external compression from renal mass. Extension into Gerota's fascia and abutment the right psoas muscle with no definite intervening plane, local invasion can not be excluded (for example, series 6, image 110 and series 9, image 93). No evidence of renal vein invasion. Left kidney is unremarkable. Stomach/Bowel: Stomach is unremarkable. Visualized small and large bowel unremarkable. Vascular/Lymphatic: Normal caliber thoracic aorta with mild atherosclerotic disease. Patent portal vein. Numerous abdominal varices. No enlarged lymph nodes seen in the abdomen. Other: Large volume abdominal ascites. Partially visualized ventral abdominal wall hernia containing fluid. Musculoskeletal: No acute or significant osseous findings. IMPRESSION: 1. Enhancing heterogeneous right lower pole renal lesion is slightly increased in size when compared with February 21, 2021 CT and consistent with renal cell carcinoma. Increased abutment the right psoas muscle with no definite intervening plane, local invasion can not be excluded. 2. Moderate right hydronephrosis, likely due to obstruction of  the renal pelvis secondary external compression from renal mass. 3. Cirrhotic liver morphology with sequela of portal hypertension including splenomegaly, large volume abdominal ascites, and abdominal varices. 4. Small right pleural effusion. 5. Cholelithiasis. 6. Aortic Atherosclerosis (ICD10-I70.0). Electronically Signed   By: Allegra Lai M.D.   On: 07/09/2022 10:37   DG Chest 2 View  Result Date: 07/08/2022 CLINICAL DATA:  Pleural effusion. EXAM: CHEST - 2 VIEW COMPARISON:  11/11/2007.  Current abdomen and pelvis CT FINDINGS: Cardiac silhouette is normal in size. No mediastinal or hilar masses. No evidence of adenopathy. Elevated right hemidiaphragm. Small right pleural effusion, that appears larger on the current CT. No left pleural effusion. Mild dependent opacity at the right lung base consistent with atelectasis, stable from the current CT. Remainder of the lungs is clear. No pneumothorax. Skeletal structures are intact. IMPRESSION: 1. Small right pleural effusion, which appears larger on the current CT of the abdomen and pelvis. Mild associated dependent right lower lobe atelectasis. 2. No acute cardiopulmonary disease. Electronically Signed   By: Amie Portland M.D.   On: 07/08/2022 12:54   CT Renal Stone Study  Result Date: 07/08/2022 CLINICAL DATA:  Abdominal/flank pain with stone suspected EXAM: CT ABDOMEN AND PELVIS WITHOUT CONTRAST TECHNIQUE: Multidetector CT imaging of the abdomen and pelvis was performed following the standard protocol without IV contrast. RADIATION DOSE REDUCTION: This exam was performed according to the departmental dose-optimization program which includes automated exposure control, adjustment of the mA and/or kV according to patient size and/or use of iterative reconstruction technique. COMPARISON:  02/21/2021 FINDINGS: Lower chest: Moderate right pleural effusion since prior, with lower lobe atelectasis. Hepatobiliary: No focal liver abnormality.Cirrhotic liver.  Layering cholelithiasis. Stable gallbladder distension without evidence of superimposed acute inflammation Pancreas: Unremarkable. Spleen: Enlarged in the setting of portal hypertension, 16 cm anterior to posterior. Adrenals/Urinary Tract: Negative adrenals. No hydronephrosis or stone. Partially calcified right lower pole renal mass which appears solid and measures 5.6 cm. Unremarkable bladder. Stomach/Bowel: No obstruction. No visible bowel inflammation including appendicitis Vascular/Lymphatic: Scattered atheromatous calcification . No noted mass or adenopathy Reproductive:No pathologic findings. Other: Large volume ascites in the setting of cirrhosis. Moderate umbilical and large right inguinal hernias which contain ascitic fluid. Musculoskeletal: No acute abnormalities. IMPRESSION: 1. Cirrhosis with large volume ascites and new moderate to large right pleural effusion. There is a large right inguinal hernia distended by ascitic fluid. 2. Known solid right renal mass, presumed renal cell carcinoma. 3. Cholelithiasis. Electronically Signed   By: Tiburcio Pea M.D.   On: 07/08/2022 06:58    Assessment and plan- Patient is a 57 y.o. male with history of cirrhosis admitted for decompensated cirrhosis and ascites found to have right renal mass  Right renal mass has been a chronic finding since at least since February 2023 and he has seen urology in the past.  Dr. Signa Kell had recommended referral to [facility] for management of renal cell carcinoma given that he is high risk from his cirrhosis standpoint but patient never went for his appointment last year.  Findings this year from [facility] are more or less the same with a 5.4 cm right renal mass and moderate right hydronephrosis.  There is some abutment of the psoas muscle but systemic scans did not show any evidence of distant metastatic disease.  Nonmetastatic renal cell carcinoma needs to be managed surgically and he first needs a surgical opinion at a tertiary center.   From an oncology standpoint there is not much to do at this point as systemic treatment is not warranted presently.  Also we do not have any tissue diagnosis but typically that is deferred for surgery in this situation.   I will communicate my findings to Dr. Richardo Hanks and have asked his team to refer the patient to [facility] as an outpatient.  I will be available in the future if needed after he gets a surgical opinion    Visit Diagnosis 1. Decompensated hepatic cirrhosis (HCC)  2. Other ascites     Dr. Owens Shark, MD, MPH [facility] [phone] 07/11/2022

## 2022-07-11 NOTE — Progress Notes (Signed)
  Chaplain On-Call responded to a request from Charge Nurse Alvino Chapel that the patient wanted to complete his HCPOA document.  [facility] Volunteers Onalee Hua and [place], and Nurse Notary Ames Coupe, to assist with the completion.  They witnessed and notarized the patient's signature.  Chaplain made a copy for the patient's notebook chart, and returned the original to the patient. Chaplain provided spiritual and emotional support for the patient, as he described an impending surgery with considerable risk.  Chaplain Evelena Peat M.Div., [facility]

## 2022-07-11 NOTE — Telephone Encounter (Signed)
Per secure chat with Nehemiah Settle and Dr.Rao pt is good to be scheduled for a hospital follow up in 3-4 weeks.   I attempted to call pt to schedule this appt.   I called the home and mobile number and neither went through.

## 2022-07-11 NOTE — Consult Note (Signed)
Consultation Note Date: 07/11/2022   Patient Name: Cole Pierce  DOB: 05-Apr-1965  MRN: [id number]  Age / Sex: 57 y.o., male  PCP: Berniece Salines, FNP Referring Physician: Gillis Santa, MD  Reason for Consultation: Establishing goals of care  HPI/Patient Profile: 57 y.o. male  with past medical history of decompensated liver cirrhosis with ascites and esophageal varices, DM2, iron deficiency anemia, suspected renal cell carcinoma, rheumatoid arthritis not on DMARDs admitted on 07/08/2022 with flank pain with decompensated cirrhosis and worsening ascites, concern for for cancer with known abutment onto fascia and right psoas muscle CT Feb 2023 - concern for likely progression. He was recommended to f/u tertiary center for carcinoma due to high risk from underlying cirrhosis.   Clinical Assessment and Goals of Care: Consult received and chart review completed. I met today with Cole Pierce. No family or visitors present. Cole Pierce had many questions about his conditions and plan moving forward. We discussed in detail his renal mass, hernia, liver cirrhosis, diet and fluid restrictions. We discussed plans for surgical intervention for renal mass and hernia. He is hopeful for surgery to be completed sooner than later. He is hopeful that this can help relieve some of his pain. We spent much time discussing his liver cirrhosis along with common complications and how this makes surgical intervention more risky for him. We discussed cirrhosis and overall poor prognosis. Cole Pierce expresses understanding. He is hopeful that his diagnoses can still be managed to provide him with quality of life. He does express that he does not always feel up to going to his appointments. We discussed that if he can get to his appointments to have some intervention that he may ultimately feel better. He also has unreliable transportation and resources - will  consult TOC for guidance and potential resources.   Cole Pierce and I discuss further his wishes and goals of care. Cole Pierce and I discuss code status and poor outcomes with these measures. Cole Pierce is clear about his desire for DNR status. He shares that he would not want to be kept alive in a vegetative or bed-bound state. He shares about his father's decline with dementia as well and not wanting to be like that. He has come to peace on some level with end of life telling me "I've lived longer than I thought I might." He is hopeful for more time and to continue to be as independent as possible. He also shares that he wishes to be buried next to his parents. He shares that his brother, Cole Pierce, knows his wishes and he wishes for Cole Pierce to be his HCPOA. I assisted him complete HCPOA paperwork and awaiting this to be witnessed and notarized.   All questions/concerns addressed. Emotional support provided.   Primary Decision Maker PATIENT HCPOA brother Cole Pierce    SUMMARY OF RECOMMENDATIONS   - DNR decided - HCPOA completed - Hopeful for f/u [facility] for resection of renal mass and hernia repair  Code Status/Advance Care Planning: DNR   Symptom Management:  OxyIR 5 mg q4h PRN  pain recommended.   Prognosis:  Overall long term prognosis poor.   Discharge Planning: Home with Home Health      Primary Diagnoses: Present on Admission:  Decompensated hepatic cirrhosis (HCC)  Seropositive rheumatoid arthritis (HCC)   I have reviewed the medical record, interviewed the patient and family, and examined the patient. The following aspects are pertinent.  Past Medical History:  Diagnosis Date   Arthritis    Ascites    Cancer (HCC)    Cirrhosis (HCC)    Diabetes mellitus without complication (HCC)    Hernia, hiatal    IDA (iron deficiency anemia)    Inguinal hernia    right   Portal venous hypertension (HCC)    Renal cell carcinoma (HCC)    Splenomegaly    Thrombocytopenia (HCC)    Social History    Socioeconomic History   Marital status: Single    Spouse name: Not on file   Number of children: 3   Years of education: Not on file   Highest education level: Not on file  Occupational History   Not on file  Tobacco Use   Smoking status: Some Days    Types: Cigars   Smokeless tobacco: Never  Vaping Use   Vaping Use: Never used  Substance and Sexual Activity   Alcohol use: Not Currently    Comment: had quit for 13 years and recently started back   Drug use: Never   Sexual activity: Yes    Birth control/protection: None  Other Topics Concern   Not on file  Social History Narrative   ** Merged History Encounter **       Social Determinants of Health   Financial Resource Strain: Not on file  Food Insecurity: No Food Insecurity (07/08/2022)   Hunger Vital Sign    Worried About Running Out of Food in the Last Year: Never true    Ran Out of Food in the Last Year: Never true  Transportation Needs: No Transportation Needs (07/08/2022)   PRAPARE - Administrator, Civil Service (Medical): No    Lack of Transportation (Non-Medical): No  Physical Activity: Not on file  Stress: Not on file  Social Connections: Not on file   History reviewed. No pertinent family history. Scheduled Meds:  acetylcysteine  600 mg Oral BID   feeding supplement  237 mL Oral BID BM   furosemide  40 mg Oral Daily   insulin aspart  0-15 Units Subcutaneous TID WC   lidocaine  1 patch Transdermal Q24H   pantoprazole  40 mg Oral BID   sodium chloride flush  3 mL Intravenous Q12H   spironolactone  50 mg Oral Daily   Continuous Infusions: PRN Meds:.acetaminophen **OR** acetaminophen, cyclobenzaprine, HYDROmorphone (DILAUDID) injection, ondansetron **OR** ondansetron (ZOFRAN) IV, polyethylene glycol Allergies  Allergen Reactions   Zantac [Ranitidine Hcl] Anaphylaxis   Penicillins Hives    Hives in mouth   Review of Systems  Constitutional:  Positive for activity change, appetite change and  fatigue.  Gastrointestinal:  Positive for abdominal pain.  Musculoskeletal:  Positive for back pain.    Physical Exam Vitals and nursing note reviewed.  Constitutional:      General: He is not in acute distress. Cardiovascular:     Rate and Rhythm: Normal rate.  Pulmonary:     Effort: No tachypnea, accessory muscle usage or respiratory distress.  Abdominal:     General: Abdomen is flat.     Palpations: Abdomen is soft.  Comments: + hernia  Neurological:     Mental Status: He is alert and oriented to person, place, and time.     Vital Signs: BP 138/81 (BP Location: Left Arm)   Pulse 83   Temp 98.6 F (37 C) (Oral)   Resp 16   Ht 5\' 8"  (1.727 m)   Wt 74.7 kg   SpO2 96%   BMI 25.04 kg/m  Pain Scale: 0-10   Pain Score: 8    SpO2: SpO2: 96 % O2 Device:SpO2: 96 % O2 Flow Rate: .   IO: Intake/output summary:  Intake/Output Summary (Last 24 hours) at 07/11/2022 0853 Last data filed at 07/11/2022 0600 Gross per 24 hour  Intake 5013.01 ml  Output 3150 ml  Net 1863.01 ml    LBM: Last BM Date : 07/08/22 Baseline Weight: Weight: 79.8 kg Most recent weight: Weight: 74.7 kg     Palliative Assessment/Data:      Time Total: 90 min  Greater than 50%  of this time was spent counseling and coordinating care related to the above assessment and plan.  Signed by: Yong Channel, NP Palliative Medicine Team Pager # [phone] (M-F 8a-5p) Team Phone # [phone] (Nights/Weekends)

## 2022-07-11 NOTE — TOC Transition Note (Signed)
Transition of Care [facility]) - CM/SW Discharge Note   Patient Details  Name: Cole Pierce MRN: [id number] Date of Birth: [birth year]  Transition of Care [facility]) CM/SW Contact:  Allena Katz, LCSW Phone Number: 07/11/2022, 2:20 PM   Clinical Narrative:   Pt actively being discharged. Tiffanie, RN spoke with patient about community resources. CSW also spoke with pt about medicaid transport. Pt reported no other concerns at this time.           Patient Goals and CMS Choice      Discharge Placement                         Discharge Plan and Services Additional resources added to the After Visit Summary for                                       Social Determinants of Health (SDOH) Interventions SDOH Screenings   Food Insecurity: No Food Insecurity (07/08/2022)  Housing: Low Risk  (07/08/2022)  Transportation Needs: No Transportation Needs (07/08/2022)  Utilities: Not At Risk (07/08/2022)  Alcohol Screen: Low Risk  (04/03/2021)  Depression (PHQ2-9): Low Risk  (07/03/2021)  Tobacco Use: High Risk (07/10/2022)     Readmission Risk Interventions     No data to display

## 2022-07-11 NOTE — Discharge Summary (Signed)
[facility] Discharge Summary   Patient: Cole Pierce ZOX:[id number]  PCP: Berniece Salines, FNP  Date of admission: 07/08/2022   Date of discharge:  07/11/2022     Discharge Diagnoses:  Principal Problem:   Decompensated hepatic cirrhosis (HCC) Active Problems:   Pancytopenia (HCC)   Right low back pain   Type 2 diabetes mellitus without complication, without long-term current use of insulin (HCC)   Seropositive rheumatoid arthritis (HCC)   Renal cell carcinoma (HCC)   Protein-calorie malnutrition, severe   Admitted From: Home Disposition:  Home   Recommendations for Outpatient Follow-up:  Follow-up with PCP in 1 week.  CBC and BMP after 1 week Follow-up with [facility]rologist at [facility] and oncologist at [facility] for further management of right adrenal mass. Follow-up with GI as an outpatient for further management of liver cirrhosis. Follow up LABS/TEST:  as above   Follow-up Information     Creig Hines, MD Follow up in 1 week(s).   Specialty: Oncology Contact information: [address] [place] [address] [phone]         Berniece Salines, FNP Follow up in 1 week(s).   Specialty: Nurse Practitioner Why: Repeat CBC and BMP in 1 week Contact information: [address] [place] [address] [phone]                Diet recommendation: Diabetic diet  Activity: The patient is advised to gradually reintroduce usual activities, as tolerated  Discharge Condition: stable  Code Status: DNR   History of present illness: As per the H and P dictated on admission Hospital Course:  Cole Pierce is a 57 y.o. male with medical history significant of decompensated cirrhosis with ascites and esophageal varices of unknown etiology, type 2 diabetes, iron deficiency anemia, suspected renal cell carcinoma, rheumatoid arthritis not on DMARDs, who presents to the ED due to flank pain. severe right flank and back pain that started around midnight  and has persisted since.    ED workup: BP 155/69, rest stable.  BMP BG 161, total bili 1.5.  CBC WBC 2.6, hemoglobin 9.4, platelet 129 CT stone: 1. Cirrhosis with large volume ascites and new moderate to large right pleural effusion. There is a large right inguinal hernia distended by ascitic fluid. 2. Known solid right renal mass, presumed renal cell carcinoma. 3. Cholelithiasis.   [facility] was consulted for admission and further management as below.   Assessment and Plan: # Decompensated hepatic cirrhosis with Ascites S/p paracentesis 5 L fluid was tapped in the ED CT A/p: Cirrhotic liver morphology with sequela of portal hypertension including splenomegaly, large volume abdominal ascites, and abdominal varices. Fluid studies not consistent with SBP, fluid culture NGTD. Blood culture NGTD 7/8 S/p Lasix and Aldactone, discontinued due to AKI. S/p albumin IV infusion GI consulted. 7/9-renal functions improved, started Lasix 40 mg and Aldactone 50 mg p.o. daily.  Patient was discharged and recommended to follow with GI and PCP for further management as an outpatient. # AKI most likely due to large volume paracentesis and diuretic use Creatinine 1.63--1.08 AKI resolved, s/p IV albumin. S/p Mucomyst 600 mg p.o. twice daily for 2 days to prevent CIN. On 7/8 Discontinued Lasix and Aldactone. And on 7/9 resumed Lasix and Aldactone.  # Renal cell carcinoma, right kidney with possible local metastasis Patient presented with right flank pain. Continue as needed medication for pain control CT A/P: Enhancing heterogeneous right lower pole renal lesion is slightly increased in size when compared with February 21, 2021 CT and consistent with renal cell carcinoma. Increased abutment the right psoas muscle with no definite intervening plane, local invasion can not be excluded. Moderate right hydronephrosis, likely due to obstruction of the renal pelvis secondary external compression from renal mass. 7/8 discussed  with oncologist Dr. Smith Robert, recommended CT chest without contrast and bone scan and follow-up as an outpatient. CT Chest: Small to moderate-sized right pleural effusion with compressive atelectasis of the right lower lobe. Diffuse wall thickening of the esophagus may be related to esophagitis. Liver cirrhosis, ascites and splenomegaly. Cholelithiasis. Residual contrast in the right kidney may be related to medical renal disease or obstruction. Bone scan: No scintigraphic evidence of osseous metastatic disease. Asymmetrically increased radiotracer uptake within the RIGHT kidney which can be seen in the setting of obstruction. Urology consulted, recommended to follow with a urologist at [facility] as patient was referred in the past but never followed. Palliative care consulted for goals of care discussion, and CODE STATUS was changed to DNR/DNI. Medical oncology recommended due to known metastatic renal cancer surgery would be the best option and recommended to refer to [facility] urologist.  Patient can follow-up with oncologist as an outpatient. # Right pleural effusion, could be due to underlying malignancy. currently patient is not in respiratory distress, saturating well on room air.  Started Lasix and Aldactone to diurese.  Recommend to follow with PCP, patient may need thoracentesis in future.  CT chest shows small to moderate right-sided pleural effusion and atelectasis.  No metastasis. # Pancytopenia: due to liver cirrhosis and splenomegaly. No bleeding at this time. # NIDDM T2: s/p NovoLog sliding scale.  Patient is not on any medications currently.  Advised to continue diabetic diet and follow with PCP.  Monitor CBG at home # Seropositive rheumatoid arthritis: Patient has a history of rheumatoid arthritis with intermittent pain in his hands and feet.  Not currently on any DMARDs.  He has established with rheumatology.  Body mass index is 25.04 kg/m.  Nutrition Problem: Severe Malnutrition Etiology: chronic  illness (cirrhosis) Nutrition Interventions: Interventions: Ensure Enlive (each supplement provides 350kcal and 20 grams of protein), MVI, Liberalize Diet, Magic cup   Pain control  - [place] Controlled Substance Reporting System database was reviewed. - 7 day supply was provided. - Patient was instructed, not to drive, operate heavy machinery, perform activities at heights, swimming or participation in water activities or provide baby sitting services while on Pain, Sleep and Anxiety Medications; until his outpatient Physician has advised to do so again.  - Also recommended to not to take more than prescribed Pain, Sleep and Anxiety Medications.  Patient was ambulatory without any assistance. On the day of the discharge the patient's vitals were stable, and no other acute medical condition were reported by patient. the patient was felt safe to be discharge at Home.  Consultants: GI, medical oncologist, urologist, palliative care Procedures: Paracentesis 5 L fluid was tapped  Discharge Exam: General: Appear in no distress, no Rash; Oral Mucosa Clear, moist. Cardiovascular: S1 and S2 Present, no Murmur, Respiratory: normal respiratory effort, Bilateral Air entry present and no Crackles, no wheezes Abdomen: Bowel Sound present, Soft and right upper quadrant and right flank tenderness, no hernia Extremities: no Pedal edema, no calf tenderness Neurology: alert and oriented to time, place, and person affect appropriate.  Filed Weights   07/08/22 0603 07/08/22 1641 07/10/22 0447  Weight: 79.8 kg 72.1 kg 74.7 kg   Vitals:   07/11/22 0502 07/11/22 0828  BP: 111/64 138/81  Pulse: 79 83  Resp: 18 16  Temp: 98.5 F (36.9 C) 98.6 F (37 C)  SpO2: 94% 96%    DISCHARGE MEDICATION: Allergies as of 07/11/2022       Reactions   Zantac [ranitidine Hcl] Anaphylaxis   Penicillins Hives   Hives in mouth        Medication List     STOP taking these medications     dapagliflozin propanediol 5 MG Tabs tablet Commonly known as: Farxiga   glipiZIDE 5 MG tablet Commonly known as: GLUCOTROL   hydrocortisone 2.5 % cream   hydrOXYzine 25 MG capsule Commonly known as: VISTARIL   ivermectin 3 MG Tabs tablet Commonly known as: STROMECTOL   permethrin 5 % cream Commonly known as: ELIMITE   predniSONE 5 MG tablet Commonly known as: DELTASONE   triamcinolone cream 0.1 % Commonly known as: KENALOG       TAKE these medications    acetaminophen 325 MG tablet Commonly known as: TYLENOL Take 2 tablets (650 mg total) by mouth every 6 (six) hours as needed for mild pain, moderate pain, fever or headache (or Fever >/= 101).   Blood Glucose Monitoring Suppl w/Device Kit 1 each by Does not apply route daily.   BLOOD GLUCOSE TEST STRIPS Strp Use as directed to monitor FSBS once daily for DM   cyclobenzaprine 5 MG tablet Commonly known as: FLEXERIL Take 1 tablet (5 mg total) by mouth 2 (two) times daily as needed for muscle spasms.   furosemide 40 MG tablet Commonly known as: LASIX Take 1 tablet (40 mg total) by mouth daily. Start taking on: July 12, 2022   lactulose 10 g packet Commonly known as: CEPHULAC Take 1 packet (10 g total) by mouth 3 (three) times daily as needed (titrate to 1-2 BM per day).   oxyCODONE 5 MG immediate release tablet Commonly known as: Oxy IR/ROXICODONE Take 1 tablet (5 mg total) by mouth every 8 (eight) hours as needed for up to 7 days for severe pain or moderate pain.   pantoprazole 40 MG tablet Commonly known as: PROTONIX Take 1 tablet (40 mg total) by mouth 2 (two) times daily.   spironolactone 50 MG tablet Commonly known as: ALDACTONE Take 1 tablet (50 mg total) by mouth daily. Start taking on: July 12, 2022       Allergies  Allergen Reactions   Zantac [Ranitidine Hcl] Anaphylaxis   Penicillins Hives    Hives in mouth   Discharge Instructions     Call MD for:  difficulty breathing, headache or  visual disturbances   Complete by: As directed    Call MD for:  extreme fatigue   Complete by: As directed    Call MD for:  persistant dizziness or light-headedness   Complete by: As directed    Call MD for:  persistant nausea and vomiting   Complete by: As directed    Call MD for:  severe uncontrolled pain   Complete by: As directed    Call MD for:  temperature >100.4   Complete by: As directed    Diet - low sodium heart healthy   Complete by: As directed    Discharge instructions   Complete by: As directed    Follow-up with PCP in 1 week.  CBC and BMP after 1 week Follow-up with urologist at [facility] and oncologist at [facility] for further management of right adrenal mass. Follow-up with GI as an outpatient for further management of liver cirrhosis.   Increase  activity slowly   Complete by: As directed        The results of significant diagnostics from this hospitalization (including imaging, microbiology, ancillary and laboratory) are listed below for reference.    Significant Diagnostic Studies: NM Bone Scan Whole Body  Result Date: 07/10/2022 CLINICAL DATA:  Occult malignancy. History of a RIGHT renal mass consistent with renal cell carcinoma. EXAM: NUCLEAR MEDICINE WHOLE BODY BONE SCAN TECHNIQUE: Whole body anterior and posterior images were obtained approximately 3 hours after intravenous injection of radiopharmaceutical. RADIOPHARMACEUTICALS:  20.54 mCi Technetium-54m MDP IV COMPARISON:  CT dated July 09, 2022 a July 08, 2022 FINDINGS: No suspicious focal radiotracer uptake. There is radiotracer uptake within the sternoclavicular joints and bilateral shoulders This is favored to be degenerative in etiology. Expected physiologic distribution of radiotracer. There is asymmetric radiotracer uptake within the RIGHT kidney which could be due to obstructive physiology. IMPRESSION: 1.  No scintigraphic evidence of osseous metastatic disease. 2. Asymmetrically increased radiotracer uptake within the  RIGHT kidney which can be seen in the setting of obstruction. Electronically Signed   By: Meda Klinefelter M.D.   On: 07/10/2022 13:47   CT CHEST WO CONTRAST  Result Date: 07/09/2022 CLINICAL DATA:  Occult malignancy EXAM: CT CHEST WITHOUT CONTRAST TECHNIQUE: Multidetector CT imaging of the chest was performed following the standard protocol without IV contrast. RADIATION DOSE REDUCTION: This exam was performed according to the departmental dose-optimization program which includes automated exposure control, adjustment of the mA and/or kV according to patient size and/or use of iterative reconstruction technique. COMPARISON:  CT abdomen 07/09/2022. FINDINGS: Cardiovascular: No significant vascular findings. Normal heart size. No pericardial effusion. Mediastinum/Nodes: There is diffuse wall thickening of the esophagus. The visualized thyroid gland is within normal limits. No definite enlarged mediastinal, hilar or axillary lymph nodes are seen. Lungs/Pleura: There is a small to moderate-sized right pleural effusion. There is compressive atelectasis of the right lower lobe. The lungs are otherwise clear. There is no pneumothorax. Trachea and central airways are patent. Upper Abdomen: Again seen is liver cirrhosis, ascites and splenomegaly. Gallstones are again noted. Residual contrast is seen within the right kidney which may be related to medical renal disease or obstruction. Musculoskeletal: No chest wall mass or suspicious bone lesions identified. IMPRESSION: 1. Small to moderate-sized right pleural effusion with compressive atelectasis of the right lower lobe. 2. Diffuse wall thickening of the esophagus may be related to esophagitis. 3. Liver cirrhosis, ascites and splenomegaly. 4. Cholelithiasis. 5. Residual contrast in the right kidney may be related to medical renal disease or obstruction. Electronically Signed   By: Darliss Cheney M.D.   On: 07/09/2022 19:27   CT RENAL ABD W/WO  Result Date:  07/09/2022 CLINICAL DATA:  RCC; * Tracking Code: BO * EXAM: CT ABDOMEN WITHOUT AND WITH CONTRAST TECHNIQUE: Multidetector CT imaging of the abdomen was performed following the standard protocol before and following the bolus administration of intravenous contrast. RADIATION DOSE REDUCTION: This exam was performed according to the departmental dose-optimization program which includes automated exposure control, adjustment of the mA and/or kV according to patient size and/or use of iterative reconstruction technique. CONTRAST:  OMNIPAQUE IOHEXOL 300 MG/ML  SOLN COMPARISON:  CT of the abdomen and pelvis dated July 08, 2022; contrast-enhanced CT abdomen dated February 25, 2021 FINDINGS: Lower chest: Small right pleural effusion bilateral lower lobe atelectasis. Small hiatal hernia and paraesophageal varices. Hepatobiliary: Cirrhotic liver morphology. No suspicious liver lesions. Numerous gallstones. No biliary ductal dilation. Pancreas: Unremarkable. No pancreatic ductal  dilatation or surrounding inflammatory changes. Spleen: Splenomegaly, measuring up to 15.5 cm. Adrenals/Urinary Tract: Bilateral adrenal glands are unremarkable. Heterogeneous enhancing lesion of the anterior lower pole of the right kidney measuring 5.8 x 4.5 cm, previously 5.4 x 4.3 cm on prior 23 abdomen CT. Moderate right hydronephrosis, unchanged when compared with the prior and likely due to obstruction of the renal pelvis secondary external compression from renal mass. Extension into Gerota's fascia and abutment the right psoas muscle with no definite intervening plane, local invasion can not be excluded (for example, series 6, image 110 and series 9, image 93). No evidence of renal vein invasion. Left kidney is unremarkable. Stomach/Bowel: Stomach is unremarkable. Visualized small and large bowel unremarkable. Vascular/Lymphatic: Normal caliber thoracic aorta with mild atherosclerotic disease. Patent portal vein. Numerous abdominal varices.  No enlarged lymph nodes seen in the abdomen. Other: Large volume abdominal ascites. Partially visualized ventral abdominal wall hernia containing fluid. Musculoskeletal: No acute or significant osseous findings. IMPRESSION: 1. Enhancing heterogeneous right lower pole renal lesion is slightly increased in size when compared with February 21, 2021 CT and consistent with renal cell carcinoma. Increased abutment the right psoas muscle with no definite intervening plane, local invasion can not be excluded. 2. Moderate right hydronephrosis, likely due to obstruction of the renal pelvis secondary external compression from renal mass. 3. Cirrhotic liver morphology with sequela of portal hypertension including splenomegaly, large volume abdominal ascites, and abdominal varices. 4. Small right pleural effusion. 5. Cholelithiasis. 6. Aortic Atherosclerosis (ICD10-I70.0). Electronically Signed   By: Allegra Lai M.D.   On: 07/09/2022 10:37   DG Chest 2 View  Result Date: 07/08/2022 CLINICAL DATA:  Pleural effusion. EXAM: CHEST - 2 VIEW COMPARISON:  11/11/2007.  Current abdomen and pelvis CT FINDINGS: Cardiac silhouette is normal in size. No mediastinal or hilar masses. No evidence of adenopathy. Elevated right hemidiaphragm. Small right pleural effusion, that appears larger on the current CT. No left pleural effusion. Mild dependent opacity at the right lung base consistent with atelectasis, stable from the current CT. Remainder of the lungs is clear. No pneumothorax. Skeletal structures are intact. IMPRESSION: 1. Small right pleural effusion, which appears larger on the current CT of the abdomen and pelvis. Mild associated dependent right lower lobe atelectasis. 2. No acute cardiopulmonary disease. Electronically Signed   By: Amie Portland M.D.   On: 07/08/2022 12:54   CT Renal Stone Study  Result Date: 07/08/2022 CLINICAL DATA:  Abdominal/flank pain with stone suspected EXAM: CT ABDOMEN AND PELVIS WITHOUT CONTRAST  TECHNIQUE: Multidetector CT imaging of the abdomen and pelvis was performed following the standard protocol without IV contrast. RADIATION DOSE REDUCTION: This exam was performed according to the departmental dose-optimization program which includes automated exposure control, adjustment of the mA and/or kV according to patient size and/or use of iterative reconstruction technique. COMPARISON:  02/21/2021 FINDINGS: Lower chest: Moderate right pleural effusion since prior, with lower lobe atelectasis. Hepatobiliary: No focal liver abnormality.Cirrhotic liver. Layering cholelithiasis. Stable gallbladder distension without evidence of superimposed acute inflammation Pancreas: Unremarkable. Spleen: Enlarged in the setting of portal hypertension, 16 cm anterior to posterior. Adrenals/Urinary Tract: Negative adrenals. No hydronephrosis or stone. Partially calcified right lower pole renal mass which appears solid and measures 5.6 cm. Unremarkable bladder. Stomach/Bowel: No obstruction. No visible bowel inflammation including appendicitis Vascular/Lymphatic: Scattered atheromatous calcification . No noted mass or adenopathy Reproductive:No pathologic findings. Other: Large volume ascites in the setting of cirrhosis. Moderate umbilical and large right inguinal hernias which contain ascitic fluid. Musculoskeletal: No  acute abnormalities. IMPRESSION: 1. Cirrhosis with large volume ascites and new moderate to large right pleural effusion. There is a large right inguinal hernia distended by ascitic fluid. 2. Known solid right renal mass, presumed renal cell carcinoma. 3. Cholelithiasis. Electronically Signed   By: Tiburcio Pea M.D.   On: 07/08/2022 06:58    Microbiology: Recent Results (from the past 240 hour(s))  Blood culture (single)     Status: None (Preliminary result)   Collection Time: 07/08/22 10:34 AM   Specimen: BLOOD  Result Value Ref Range Status   Specimen Description BLOOD RIGHT WRIST  Final   Special  Requests   Final    BOTTLES DRAWN AEROBIC AND ANAEROBIC Blood Culture adequate volume   Culture   Final    NO GROWTH 2 DAYS Performed at [facility], [address] [place], [place] [address]    Report Status PENDING  Incomplete  Body fluid culture w Gram Stain     Status: None (Preliminary result)   Collection Time: 07/08/22 12:27 PM   Specimen: Peritoneal Washings; Body Fluid  Result Value Ref Range Status   Specimen Description   Final    PERITONEAL Performed at [facility], [address] [place], [place] [address]    Special Requests   Final    Immunocompromised Performed at [facility], [address] [place], [place] [address]    Gram Stain   Final    RARE WBC PRESENT, PREDOMINANTLY MONONUCLEAR NO ORGANISMS SEEN    Culture   Final    NO GROWTH 3 DAYS Performed at [facility], [address] [place], [place] [address]    Report Status PENDING  Incomplete     Labs: CBC: Recent Labs  Lab 07/08/22 0739 07/09/22 0508 07/10/22 0618 07/11/22 0623  WBC 2.6* 3.4* 2.6* 2.5*  NEUTROABS 1.7 1.9  --   --   HGB 9.4* 9.6* 8.7* 9.1*  HCT 32.8* 32.8* 29.6* 31.3*  MCV 67.2* 66.3* 65.2* 64.8*  PLT 129* 124* 112* 120*   Basic Metabolic Panel: Recent Labs  Lab 07/08/22 0739 07/09/22 0508 07/10/22 0450 07/11/22 0623  NA 137 136 132* 135  K 3.8 3.5 3.7 3.5  CL 108 105 99 99  CO2 21* 23 25 27   GLUCOSE 161* 138* 126* 102*  BUN 16 17 21* 20  CREATININE 0.97 1.63* 1.13 1.08  CALCIUM 8.3* 8.1* 8.1* 8.3*  MG 2.1  --  2.1 2.0  PHOS  --   --  4.4 4.6   Liver Function Tests: Recent Labs  Lab 07/08/22 0739 07/09/22 0507  AST 27 24  ALT 16 11  ALKPHOS 78 65  BILITOT 1.5* 1.2  PROT 7.9 7.7  ALBUMIN 3.0* 2.9*   No results for input(s): "LIPASE", "AMYLASE" in the last 168 hours. Recent Labs  Lab 07/10/22 0618  AMMONIA 57*   Cardiac Enzymes: No results for input(s): "CKTOTAL", "CKMB", "CKMBINDEX", "TROPONINI" in the last  168 hours. BNP (last 3 results) No results for input(s): "BNP" in the last 8760 hours. CBG: Recent Labs  Lab 07/09/22 1920 07/10/22 0809 07/10/22 1544 07/10/22 2010 07/11/22 0830  GLUCAP 117* 109* 167* 135* 119*    Time spent: 35 minutes  Signed:  Gillis Santa  [facility]  07/11/2022 12:30 PM

## 2022-07-11 NOTE — Discharge Instructions (Addendum)
Resources  [facility] [place] Lorin Glass (social worker)   [phone]  [facility] [phone]    *You have been given a referral to the congregational/community nursing program. Please be expecting a call from them*

## 2022-07-11 NOTE — Progress Notes (Signed)
Patient verbalized understanding of all d/c instructions including importance of follow up. Pt voiced having concerns about affording rx, food, etc. Co-pay for rx verified with pharmacy and pt states he will be able to afford.   The following resources given:  [facility] [place] Lorin Glass (social worker) [phone]  [facility] [phone]   Referral made to community/congregation nursing. Pt to receive call from team at [phone]  [facility] made aware.

## 2022-07-12 ENCOUNTER — Telehealth: Payer: Self-pay | Admitting: *Deleted

## 2022-07-12 LAB — BODY FLUID CULTURE W GRAM STAIN: Culture: NO GROWTH

## 2022-07-12 NOTE — Transitions of Care (Post Inpatient/ED Visit) (Signed)
   07/12/2022  Name: Cole Pierce MRN: [id number] DOB: 06-07-1965  Today's TOC FU Call Status: Today's TOC FU Call Status:: Unsuccessul Call (1st Attempt) Unsuccessful Call (1st Attempt) Date: 07/12/22  Attempted to reach the patient regarding the most recent Inpatient visit; Attempted calls to both numbers listed for patient in EHR: on both numbers, received automated outgoing voice message stating, "we are sorry, your call cannot be completed at this time, please hang up and try your call again later;" both attempts spontaneously ended-- unable to leave voice messages on either number,  requesting call back   Follow Up Plan: Additional outreach attempts will be made to reach the patient to complete the Transitions of Care (Post Inpatient visit) call.   Caryl Pina, RN, BSN, CCRN Alumnus RN CM Care Coordination/ Transition of Care- [facility] Care Management [phone]: direct office

## 2022-07-13 ENCOUNTER — Encounter: Payer: Self-pay | Admitting: *Deleted

## 2022-07-13 ENCOUNTER — Telehealth: Payer: Self-pay | Admitting: *Deleted

## 2022-07-13 LAB — CULTURE, BLOOD (SINGLE)
Culture: NO GROWTH
Special Requests: ADEQUATE

## 2022-07-13 NOTE — Transitions of Care (Post Inpatient/ED Visit) (Signed)
   07/13/2022  Name: Cole Pierce MRN: [id number] DOB: [birth year]  Today's TOC FU Call Status: Today's TOC FU Call Status:: Unsuccessful Call (2nd Attempt) Unsuccessful Call (2nd Attempt) Date: 07/13/22  Attempted x 2, on both numbers listed for patient in EHR, to reach the patient regarding the most recent Inpatient visit; Received automated outgoing voice message on both numbers stating, "we're sorry your call cannot be completed at this time; please hang up and try your call again later;" unable to leave voice message requesting call back   Follow Up Plan: Additional outreach attempts will be made to reach the patient to complete the Transitions of Care (Post Inpatient visit) call.   Caryl Pina, RN, BSN, CCRN Alumnus RN CM Care Coordination/ Transition of Care- [facility] Care Management [phone]: direct office

## 2022-07-13 NOTE — Transitions of Care (Post Inpatient/ED Visit) (Signed)
   07/13/2022  Name: Bernardo Jodoin MRN: [id number] DOB: [birth year]  Today's TOC FU Call Status: Today's TOC FU Call Status:: Unsuccessful Call (3rd Attempt) Unsuccessful Call (3rd Attempt) Date: 07/13/22  Attempted to reach the patient regarding the most recent Inpatient visit; Received automated outgoing voice message stating that "we are unable to complete your call at this time, please hang up and try your call again later;" cal spontaneousle ended without option to leave voice message requesting call back   Follow Up Plan: No further outreach attempts will be made at this time. We have been unable to contact the patient.  Caryl Pina, RN, BSN, CCRN Alumnus RN CM Care Coordination/ Transition of Care- [facility] Care Management [phone]: direct office

## 2022-07-17 ENCOUNTER — Telehealth: Payer: Self-pay

## 2022-07-18 DIAGNOSIS — K8020 Calculus of gallbladder without cholecystitis without obstruction: Secondary | ICD-10-CM | POA: Diagnosis not present

## 2022-07-18 DIAGNOSIS — E119 Type 2 diabetes mellitus without complications: Secondary | ICD-10-CM | POA: Diagnosis not present

## 2022-07-18 DIAGNOSIS — Z1331 Encounter for screening for depression: Secondary | ICD-10-CM | POA: Diagnosis not present

## 2022-07-18 DIAGNOSIS — K429 Umbilical hernia without obstruction or gangrene: Secondary | ICD-10-CM | POA: Diagnosis not present

## 2022-07-18 DIAGNOSIS — C641 Malignant neoplasm of right kidney, except renal pelvis: Secondary | ICD-10-CM | POA: Diagnosis not present

## 2022-07-27 DIAGNOSIS — N2889 Other specified disorders of kidney and ureter: Secondary | ICD-10-CM | POA: Diagnosis not present

## 2022-08-02 DIAGNOSIS — Z419 Encounter for procedure for purposes other than remedying health state, unspecified: Secondary | ICD-10-CM | POA: Diagnosis not present

## 2022-08-07 ENCOUNTER — Inpatient Hospital Stay: Payer: Medicaid Other | Attending: Oncology | Admitting: Oncology

## 2022-08-07 ENCOUNTER — Inpatient Hospital Stay: Payer: Medicaid Other

## 2022-08-07 ENCOUNTER — Encounter: Payer: Self-pay | Admitting: Oncology

## 2022-08-07 VITALS — BP 109/73 | HR 86 | Temp 97.8°F | Resp 18 | Ht 68.0 in | Wt 171.5 lb

## 2022-08-07 DIAGNOSIS — Z79899 Other long term (current) drug therapy: Secondary | ICD-10-CM | POA: Diagnosis not present

## 2022-08-07 DIAGNOSIS — K7460 Unspecified cirrhosis of liver: Secondary | ICD-10-CM | POA: Insufficient documentation

## 2022-08-07 DIAGNOSIS — D61818 Other pancytopenia: Secondary | ICD-10-CM

## 2022-08-07 LAB — CBC WITH DIFFERENTIAL/PLATELET
Abs Immature Granulocytes: 0.01 10*3/uL (ref 0.00–0.07)
Basophils Absolute: 0 10*3/uL (ref 0.0–0.1)
Basophils Relative: 1 %
Eosinophils Absolute: 0.1 10*3/uL (ref 0.0–0.5)
Eosinophils Relative: 4 %
HCT: 32.1 % — ABNORMAL LOW (ref 39.0–52.0)
Hemoglobin: 9.3 g/dL — ABNORMAL LOW (ref 13.0–17.0)
Immature Granulocytes: 0 %
Lymphocytes Relative: 17 %
Lymphs Abs: 0.5 10*3/uL — ABNORMAL LOW (ref 0.7–4.0)
MCH: 19.5 pg — ABNORMAL LOW (ref 26.0–34.0)
MCHC: 29 g/dL — ABNORMAL LOW (ref 30.0–36.0)
MCV: 67.4 fL — ABNORMAL LOW (ref 80.0–100.0)
Monocytes Absolute: 0.4 10*3/uL (ref 0.1–1.0)
Monocytes Relative: 14 %
Neutro Abs: 2 10*3/uL (ref 1.7–7.7)
Neutrophils Relative %: 64 %
Platelets: 133 10*3/uL — ABNORMAL LOW (ref 150–400)
RBC: 4.76 MIL/uL (ref 4.22–5.81)
RDW: 18.6 % — ABNORMAL HIGH (ref 11.5–15.5)
WBC: 3.1 10*3/uL — ABNORMAL LOW (ref 4.0–10.5)
nRBC: 0 % (ref 0.0–0.2)

## 2022-08-07 LAB — COMPREHENSIVE METABOLIC PANEL
ALT: 23 U/L (ref 0–44)
AST: 23 U/L (ref 15–41)
Albumin: 3.2 g/dL — ABNORMAL LOW (ref 3.5–5.0)
Alkaline Phosphatase: 84 U/L (ref 38–126)
Anion gap: 5 (ref 5–15)
BUN: 13 mg/dL (ref 6–20)
CO2: 23 mmol/L (ref 22–32)
Calcium: 7.9 mg/dL — ABNORMAL LOW (ref 8.9–10.3)
Chloride: 106 mmol/L (ref 98–111)
Creatinine, Ser: 0.67 mg/dL (ref 0.61–1.24)
GFR, Estimated: >60 mL/min (ref >60)
Glucose, Bld: 171 mg/dL — ABNORMAL HIGH (ref 70–99)
Potassium: 4.1 mmol/L (ref 3.5–5.1)
Sodium: 134 mmol/L — ABNORMAL LOW (ref 135–145)
Total Bilirubin: 1.4 mg/dL — ABNORMAL HIGH (ref 0.3–1.2)
Total Protein: 7.9 g/dL (ref 6.5–8.1)

## 2022-08-07 LAB — RETICULOCYTES
Immature Retic Fract: 15.4 % (ref 2.3–15.9)
RBC.: 4.83 MIL/uL (ref 4.22–5.81)
Retic Count, Absolute: 66.2 10*3/uL (ref 19.0–186.0)
Retic Ct Pct: 1.4 % (ref 0.4–3.1)

## 2022-08-07 LAB — FOLATE: Folate: 15.1 ng/mL (ref >5.9)

## 2022-08-07 LAB — FERRITIN: Ferritin: 6 ng/mL — ABNORMAL LOW (ref 24–336)

## 2022-08-07 LAB — VITAMIN B12: Vitamin B-12: 304 pg/mL (ref 180–914)

## 2022-08-07 LAB — TSH: TSH: 1.371 u[IU]/mL (ref 0.350–4.500)

## 2022-08-07 NOTE — Progress Notes (Signed)
Hematology/Oncology Consult note [facility] Telephone:([phone] [phone]  Patient Care Team: Berniece Salines, FNP as PCP - General (Nurse Practitioner) Creig Hines, MD as Consulting Physician (Oncology)   Name of the patient: Cole Pierce  [id number]  08-10-1965    Reason for referral-pancytopenia   Referring physician-Julie Zane Herald, FNP  Date of visit: 08/07/22   History of presenting illness- Patient is a 57 year old male with a past medical history significant for liver cirrhosis with esophageal varices type 2 diabetes, chronic pancytopenia rheumatoid arthritis as well as suspected renal cell carcinoma.  He was admitted to the hospital with symptoms of back pain and flank pain in July 2024 and had to undergo paracentesis and subsequently treated for UTI as well.  He had a CT chest abdomen pelvis done in the hospital which showed increase in the size of right lower pole renal renal lesion with increased abutment of the right psoas muscle as compared to February 2023.  CT chest and bone scan did not show any evidence of distant metastatic disease.  He was found to have moderate-sized right pleural effusion of unclear etiology.  With regards to his pancytopenia this has been a chronic issue patient has had chronic microcytic anemia with a hemoglobin that has remained around 9 for the last year along with chronic thrombocytopenia with a platelet count that fluctuates between 100s to 130s.  Mild leukopenia with a white count between 2-4 with absolute lymphopenia.  ECOG PS- 1  Pain scale- 0   Review of systems- Review of Systems  Constitutional:  Positive for malaise/fatigue. Negative for chills, fever and weight loss.  HENT:  Negative for congestion, ear discharge and nosebleeds.   Eyes:  Negative for blurred vision.  Respiratory:  Negative for cough, hemoptysis, sputum production, shortness of breath and wheezing.   Cardiovascular:  Negative  for chest pain, palpitations, orthopnea and claudication.  Gastrointestinal:  Negative for abdominal pain, blood in stool, constipation, diarrhea, heartburn, melena, nausea and vomiting.  Genitourinary:  Negative for dysuria, flank pain, frequency, hematuria and urgency.  Musculoskeletal:  Negative for back pain, joint pain and myalgias.  Skin:  Negative for rash.  Neurological:  Negative for dizziness, tingling, focal weakness, seizures, weakness and headaches.  Endo/Heme/Allergies:  Does not bruise/bleed easily.  Psychiatric/Behavioral:  Negative for depression and suicidal ideas. The patient does not have insomnia.     Allergies  Allergen Reactions   Zantac [Ranitidine Hcl] Anaphylaxis   Penicillins Hives    Hives in mouth    Patient Active Problem List   Diagnosis Date Noted   Protein-calorie malnutrition, severe 07/10/2022   Decompensated hepatic cirrhosis (HCC) 07/08/2022   Pancytopenia (HCC) 07/08/2022   Renal cell carcinoma (HCC) 07/08/2022   Right low back pain 07/08/2022   Right inguinal hernia 08/22/2021   Calculus of gallbladder without cholecystitis without obstruction 08/22/2021   Type 2 diabetes mellitus without complication, without long-term current use of insulin (HCC) 01/19/2021   Cirrhosis of liver with ascites (HCC) 01/19/2021   Abdominal fluid collection 01/19/2021   Iron deficiency anemia 07/07/2020   Thrombocytopenia (HCC) 07/07/2020   Umbilical hernia 07/07/2020   Seropositive rheumatoid arthritis (HCC) 03/25/2020     Past Medical History:  Diagnosis Date   Arthritis    Ascites    Cancer (HCC)    Cirrhosis (HCC)    Diabetes mellitus without complication (HCC)    Hernia, hiatal    IDA (iron deficiency anemia)    Inguinal hernia  right   Portal venous hypertension (HCC)    Renal cell carcinoma (HCC)    Splenomegaly    Thrombocytopenia (HCC)      Past Surgical History:  Procedure Laterality Date   COLONOSCOPY N/A 06/19/2021   Procedure:  COLONOSCOPY;  Surgeon: Regis Bill, MD;  Location: [facility] ENDOSCOPY;  Service: Endoscopy;  Laterality: N/A;   ESOPHAGOGASTRODUODENOSCOPY N/A 06/19/2021   Procedure: ESOPHAGOGASTRODUODENOSCOPY (EGD);  Surgeon: Regis Bill, MD;  Location: [facility] ENDOSCOPY;  Service: Endoscopy;  Laterality: N/A;   FRACTURE SURGERY      Social History   Socioeconomic History   Marital status: Single    Spouse name: Not on file   Number of children: 3   Years of education: Not on file   Highest education level: Not on file  Occupational History   Not on file  Tobacco Use   Smoking status: Some Days    Types: Cigars   Smokeless tobacco: Never  Vaping Use   Vaping status: Never Used  Substance and Sexual Activity   Alcohol use: Not Currently    Comment: had quit for 13 years and recently started back   Drug use: Never   Sexual activity: Yes    Birth control/protection: None  Other Topics Concern   Not on file  Social History Narrative   ** Merged History Encounter **       Social Determinants of Health   Financial Resource Strain: High Risk (07/07/2020)   Received from [facility]   Overall Financial Resource Strain (CARDIA)    Difficulty of Paying Living Expenses: Very hard  Food Insecurity: Food Insecurity Present (08/07/2022)   Hunger Vital Sign    Worried About Running Out of Food in the Last Year: Sometimes true    Ran Out of Food in the Last Year: Sometimes true  Transportation Needs: No Transportation Needs (08/07/2022)   PRAPARE - Administrator, Civil Service (Medical): No    Lack of Transportation (Non-Medical): No  Physical Activity: Not on file  Stress: Not on file  Social Connections: Not on file  Intimate Partner Violence: Not At Risk (08/07/2022)   Humiliation, Afraid, Rape, and Kick questionnaire    Fear of Current or Ex-Partner: No    Emotionally Abused: No    Physically Abused: No    Sexually Abused: No     History reviewed. No  pertinent family history.   Current Outpatient Medications:    acetaminophen (TYLENOL) 325 MG tablet, Take 2 tablets (650 mg total) by mouth every 6 (six) hours as needed for mild pain, moderate pain, fever or headache (or Fever >/= 101)., Disp: , Rfl:    Blood Glucose Monitoring Suppl w/Device KIT, 1 each by Does not apply route daily., Disp: 1 kit, Rfl: 0   cyclobenzaprine (FLEXERIL) 5 MG tablet, Take 1 tablet (5 mg total) by mouth 2 (two) times daily as needed for muscle spasms., Disp: 30 tablet, Rfl: 0   furosemide (LASIX) 40 MG tablet, Take 1 tablet (40 mg total) by mouth daily., Disp: 30 tablet, Rfl: 2   Glucose Blood (BLOOD GLUCOSE TEST STRIPS) STRP, Use as directed to monitor FSBS once daily for DM, Disp: 100 strip, Rfl: 3   lactulose (CEPHULAC) 10 g packet, Take 1 packet (10 g total) by mouth 3 (three) times daily as needed (titrate to 1-2 BM per day)., Disp: 30 each, Rfl: 0   lactulose (CEPHULAC) 10 g packet, Take 10 g by mouth 3 (  three) times daily., Disp: , Rfl:    oxyCODONE (OXY IR/ROXICODONE) 5 MG immediate release tablet, Take 5 mg by mouth every 4 (four) hours as needed for severe pain., Disp: , Rfl:    pantoprazole (PROTONIX) 40 MG tablet, Take 1 tablet (40 mg total) by mouth 2 (two) times daily., Disp: 60 tablet, Rfl: 0   spironolactone (ALDACTONE) 50 MG tablet, Take 1 tablet (50 mg total) by mouth daily., Disp: 30 tablet, Rfl: 2   Physical exam:  Vitals:   08/07/22 0949  BP: 109/73  Pulse: 86  Resp: 18  Temp: 97.8 F (36.6 C)  TempSrc: Tympanic  SpO2: 100%  Weight: 171 lb 8 oz (77.8 kg)  Height: 5\' 8"  (1.727 m)   Physical Exam Cardiovascular:     Rate and Rhythm: Normal rate and regular rhythm.     Heart sounds: Normal heart sounds.  Pulmonary:     Effort: Pulmonary effort is normal.     Breath sounds: Normal breath sounds.  Abdominal:     General: Bowel sounds are normal. There is no distension.     Palpations: Abdomen is soft.     Tenderness: There is no  abdominal tenderness.  Skin:    General: Skin is warm and dry.  Neurological:     Mental Status: He is alert and oriented to person, place, and time.           Latest Ref Rng & Units 08/07/2022   10:43 AM  CMP  Glucose 70 - 99 mg/dL 710   BUN 6 - 20 mg/dL 13   Creatinine 6.26 - 1.24 mg/dL 9.48   Sodium 546 - 270 mmol/L 134   Potassium 3.5 - 5.1 mmol/L 4.1   Chloride 98 - 111 mmol/L 106   CO2 22 - 32 mmol/L 23   Calcium 8.9 - 10.3 mg/dL 7.9   Total Protein 6.5 - 8.1 g/dL 7.9   Total Bilirubin 0.3 - 1.2 mg/dL 1.4   Alkaline Phos 38 - 126 U/L 84   AST 15 - 41 U/L 23   ALT 0 - 44 U/L 23       Latest Ref Rng & Units 08/07/2022   10:43 AM  CBC  WBC 4.0 - 10.5 K/uL 3.1   Hemoglobin 13.0 - 17.0 g/dL 9.3   Hematocrit 35.0 - 52.0 % 32.1   Platelets 150 - 400 K/uL 133     No images are attached to the encounter.  NM Bone Scan Whole Body  Result Date: 07/10/2022 CLINICAL DATA:  Occult malignancy. History of a RIGHT renal mass consistent with renal cell carcinoma. EXAM: NUCLEAR MEDICINE WHOLE BODY BONE SCAN TECHNIQUE: Whole body anterior and posterior images were obtained approximately 3 hours after intravenous injection of radiopharmaceutical. RADIOPHARMACEUTICALS:  20.54 mCi Technetium-13m MDP IV COMPARISON:  CT dated July 09, 2022 a July 08, 2022 FINDINGS: No suspicious focal radiotracer uptake. There is radiotracer uptake within the sternoclavicular joints and bilateral shoulders This is favored to be degenerative in etiology. Expected physiologic distribution of radiotracer. There is asymmetric radiotracer uptake within the RIGHT kidney which could be due to obstructive physiology. IMPRESSION: 1.  No scintigraphic evidence of osseous metastatic disease. 2. Asymmetrically increased radiotracer uptake within the RIGHT kidney which can be seen in the setting of obstruction. Electronically Signed   By: Meda Klinefelter M.D.   On: 07/10/2022 13:47   CT CHEST WO CONTRAST  Result Date:  07/09/2022 CLINICAL DATA:  Occult malignancy EXAM: CT CHEST WITHOUT CONTRAST TECHNIQUE:  Multidetector CT imaging of the chest was performed following the standard protocol without IV contrast. RADIATION DOSE REDUCTION: This exam was performed according to the departmental dose-optimization program which includes automated exposure control, adjustment of the mA and/or kV according to patient size and/or use of iterative reconstruction technique. COMPARISON:  CT abdomen 07/09/2022. FINDINGS: Cardiovascular: No significant vascular findings. Normal heart size. No pericardial effusion. Mediastinum/Nodes: There is diffuse wall thickening of the esophagus. The visualized thyroid gland is within normal limits. No definite enlarged mediastinal, hilar or axillary lymph nodes are seen. Lungs/Pleura: There is a small to moderate-sized right pleural effusion. There is compressive atelectasis of the right lower lobe. The lungs are otherwise clear. There is no pneumothorax. Trachea and central airways are patent. Upper Abdomen: Again seen is liver cirrhosis, ascites and splenomegaly. Gallstones are again noted. Residual contrast is seen within the right kidney which may be related to medical renal disease or obstruction. Musculoskeletal: No chest wall mass or suspicious bone lesions identified. IMPRESSION: 1. Small to moderate-sized right pleural effusion with compressive atelectasis of the right lower lobe. 2. Diffuse wall thickening of the esophagus may be related to esophagitis. 3. Liver cirrhosis, ascites and splenomegaly. 4. Cholelithiasis. 5. Residual contrast in the right kidney may be related to medical renal disease or obstruction. Electronically Signed   By: Darliss Cheney M.D.   On: 07/09/2022 19:27   CT RENAL ABD W/WO  Result Date: 07/09/2022 CLINICAL DATA:  RCC; * Tracking Code: BO * EXAM: CT ABDOMEN WITHOUT AND WITH CONTRAST TECHNIQUE: Multidetector CT imaging of the abdomen was performed following the standard  protocol before and following the bolus administration of intravenous contrast. RADIATION DOSE REDUCTION: This exam was performed according to the departmental dose-optimization program which includes automated exposure control, adjustment of the mA and/or kV according to patient size and/or use of iterative reconstruction technique. CONTRAST:  OMNIPAQUE IOHEXOL 300 MG/ML  SOLN COMPARISON:  CT of the abdomen and pelvis dated July 08, 2022; contrast-enhanced CT abdomen dated February 25, 2021 FINDINGS: Lower chest: Small right pleural effusion bilateral lower lobe atelectasis. Small hiatal hernia and paraesophageal varices. Hepatobiliary: Cirrhotic liver morphology. No suspicious liver lesions. Numerous gallstones. No biliary ductal dilation. Pancreas: Unremarkable. No pancreatic ductal dilatation or surrounding inflammatory changes. Spleen: Splenomegaly, measuring up to 15.5 cm. Adrenals/Urinary Tract: Bilateral adrenal glands are unremarkable. Heterogeneous enhancing lesion of the anterior lower pole of the right kidney measuring 5.8 x 4.5 cm, previously 5.4 x 4.3 cm on prior 23 abdomen CT. Moderate right hydronephrosis, unchanged when compared with the prior and likely due to obstruction of the renal pelvis secondary external compression from renal mass. Extension into Gerota's fascia and abutment the right psoas muscle with no definite intervening plane, local invasion can not be excluded (for example, series 6, image 110 and series 9, image 93). No evidence of renal vein invasion. Left kidney is unremarkable. Stomach/Bowel: Stomach is unremarkable. Visualized small and large bowel unremarkable. Vascular/Lymphatic: Normal caliber thoracic aorta with mild atherosclerotic disease. Patent portal vein. Numerous abdominal varices. No enlarged lymph nodes seen in the abdomen. Other: Large volume abdominal ascites. Partially visualized ventral abdominal wall hernia containing fluid. Musculoskeletal: No acute or  significant osseous findings. IMPRESSION: 1. Enhancing heterogeneous right lower pole renal lesion is slightly increased in size when compared with February 21, 2021 CT and consistent with renal cell carcinoma. Increased abutment the right psoas muscle with no definite intervening plane, local invasion can not be excluded. 2. Moderate right hydronephrosis, likely due to  obstruction of the renal pelvis secondary external compression from renal mass. 3. Cirrhotic liver morphology with sequela of portal hypertension including splenomegaly, large volume abdominal ascites, and abdominal varices. 4. Small right pleural effusion. 5. Cholelithiasis. 6. Aortic Atherosclerosis (ICD10-I70.0). Electronically Signed   By: Allegra Lai M.D.   On: 07/09/2022 10:37    Assessment and plan- Patient is a 56 y.o. male with history of cirrhosis, rheumatoid arthritis, chronic pancytopenia and suspected renal cell carcinoma  Suspected renal cell carcinoma: He has an appointment with [facility] urology in 3 days time.  I have encouraged him to keep that appointment given that he does not have any evidence of metastatic disease in this needs a surgical opinion.  Given his underlying cirrhosis he is unable to undergo any surgery at [facility] given the risks involved.  No role for medical oncology at this time  Pancytopenia: Suspect this is secondary to his underlying cirrhosis and does not require a bone marrow biopsy at this time.  I am doing his complete anemia workup today including ferritin iron studies B12 folate reticulocyte count myeloma panel and serum free light chains.  I will see him back in 3 weeks time   Thank you for this kind referral and the opportunity to participate in the care of this patient   Visit Diagnosis 1. Pancytopenia (HCC)     Dr. Owens Shark, MD, MPH [facility] [phone] 08/07/2022

## 2022-08-13 DIAGNOSIS — K429 Umbilical hernia without obstruction or gangrene: Secondary | ICD-10-CM | POA: Diagnosis not present

## 2022-08-13 DIAGNOSIS — C641 Malignant neoplasm of right kidney, except renal pelvis: Secondary | ICD-10-CM | POA: Diagnosis not present

## 2022-08-20 DIAGNOSIS — K429 Umbilical hernia without obstruction or gangrene: Secondary | ICD-10-CM | POA: Diagnosis not present

## 2022-08-21 ENCOUNTER — Ambulatory Visit: Payer: Medicaid Other

## 2022-08-24 DIAGNOSIS — M069 Rheumatoid arthritis, unspecified: Secondary | ICD-10-CM | POA: Diagnosis not present

## 2022-08-24 DIAGNOSIS — N2889 Other specified disorders of kidney and ureter: Secondary | ICD-10-CM | POA: Diagnosis not present

## 2022-08-24 DIAGNOSIS — E119 Type 2 diabetes mellitus without complications: Secondary | ICD-10-CM | POA: Diagnosis not present

## 2022-08-24 DIAGNOSIS — K7460 Unspecified cirrhosis of liver: Secondary | ICD-10-CM | POA: Diagnosis not present

## 2022-08-24 DIAGNOSIS — C641 Malignant neoplasm of right kidney, except renal pelvis: Secondary | ICD-10-CM | POA: Diagnosis not present

## 2022-08-25 ENCOUNTER — Other Ambulatory Visit: Payer: Self-pay | Admitting: Nurse Practitioner

## 2022-08-25 DIAGNOSIS — K5900 Constipation, unspecified: Secondary | ICD-10-CM | POA: Diagnosis not present

## 2022-08-25 DIAGNOSIS — C641 Malignant neoplasm of right kidney, except renal pelvis: Secondary | ICD-10-CM | POA: Diagnosis not present

## 2022-08-25 DIAGNOSIS — K7031 Alcoholic cirrhosis of liver with ascites: Secondary | ICD-10-CM | POA: Diagnosis not present

## 2022-08-25 DIAGNOSIS — R109 Unspecified abdominal pain: Secondary | ICD-10-CM | POA: Diagnosis not present

## 2022-08-25 DIAGNOSIS — M059 Rheumatoid arthritis with rheumatoid factor, unspecified: Secondary | ICD-10-CM | POA: Diagnosis not present

## 2022-08-25 DIAGNOSIS — C7989 Secondary malignant neoplasm of other specified sites: Secondary | ICD-10-CM | POA: Diagnosis not present

## 2022-08-25 DIAGNOSIS — K7011 Alcoholic hepatitis with ascites: Secondary | ICD-10-CM | POA: Diagnosis not present

## 2022-08-25 DIAGNOSIS — K828 Other specified diseases of gallbladder: Secondary | ICD-10-CM | POA: Diagnosis not present

## 2022-08-25 DIAGNOSIS — I8510 Secondary esophageal varices without bleeding: Secondary | ICD-10-CM | POA: Diagnosis not present

## 2022-08-25 DIAGNOSIS — D61818 Other pancytopenia: Secondary | ICD-10-CM | POA: Diagnosis not present

## 2022-08-25 DIAGNOSIS — R160 Hepatomegaly, not elsewhere classified: Secondary | ICD-10-CM | POA: Diagnosis not present

## 2022-08-25 DIAGNOSIS — E119 Type 2 diabetes mellitus without complications: Secondary | ICD-10-CM | POA: Diagnosis not present

## 2022-08-25 DIAGNOSIS — D72819 Decreased white blood cell count, unspecified: Secondary | ICD-10-CM | POA: Diagnosis not present

## 2022-08-25 DIAGNOSIS — K4090 Unilateral inguinal hernia, without obstruction or gangrene, not specified as recurrent: Secondary | ICD-10-CM | POA: Diagnosis not present

## 2022-08-25 DIAGNOSIS — K429 Umbilical hernia without obstruction or gangrene: Secondary | ICD-10-CM | POA: Diagnosis not present

## 2022-08-25 DIAGNOSIS — R1084 Generalized abdominal pain: Secondary | ICD-10-CM | POA: Diagnosis not present

## 2022-08-25 DIAGNOSIS — E440 Moderate protein-calorie malnutrition: Secondary | ICD-10-CM | POA: Diagnosis not present

## 2022-08-25 DIAGNOSIS — K8020 Calculus of gallbladder without cholecystitis without obstruction: Secondary | ICD-10-CM | POA: Diagnosis not present

## 2022-08-25 DIAGNOSIS — K766 Portal hypertension: Secondary | ICD-10-CM | POA: Diagnosis not present

## 2022-08-26 DIAGNOSIS — K8020 Calculus of gallbladder without cholecystitis without obstruction: Secondary | ICD-10-CM | POA: Diagnosis not present

## 2022-08-26 DIAGNOSIS — N5082 Scrotal pain: Secondary | ICD-10-CM | POA: Diagnosis not present

## 2022-08-26 DIAGNOSIS — Z9889 Other specified postprocedural states: Secondary | ICD-10-CM | POA: Diagnosis not present

## 2022-08-26 DIAGNOSIS — R161 Splenomegaly, not elsewhere classified: Secondary | ICD-10-CM | POA: Diagnosis not present

## 2022-08-26 DIAGNOSIS — C7989 Secondary malignant neoplasm of other specified sites: Secondary | ICD-10-CM | POA: Diagnosis not present

## 2022-08-26 DIAGNOSIS — R188 Other ascites: Secondary | ICD-10-CM | POA: Diagnosis not present

## 2022-08-26 DIAGNOSIS — Z6826 Body mass index (BMI) 26.0-26.9, adult: Secondary | ICD-10-CM | POA: Diagnosis not present

## 2022-08-26 DIAGNOSIS — E119 Type 2 diabetes mellitus without complications: Secondary | ICD-10-CM | POA: Diagnosis not present

## 2022-08-26 DIAGNOSIS — D61818 Other pancytopenia: Secondary | ICD-10-CM | POA: Diagnosis not present

## 2022-08-26 DIAGNOSIS — M059 Rheumatoid arthritis with rheumatoid factor, unspecified: Secondary | ICD-10-CM | POA: Diagnosis not present

## 2022-08-26 DIAGNOSIS — R109 Unspecified abdominal pain: Secondary | ICD-10-CM | POA: Diagnosis not present

## 2022-08-26 DIAGNOSIS — K5900 Constipation, unspecified: Secondary | ICD-10-CM | POA: Diagnosis not present

## 2022-08-26 DIAGNOSIS — D72819 Decreased white blood cell count, unspecified: Secondary | ICD-10-CM | POA: Diagnosis not present

## 2022-08-26 DIAGNOSIS — C641 Malignant neoplasm of right kidney, except renal pelvis: Secondary | ICD-10-CM | POA: Diagnosis not present

## 2022-08-26 DIAGNOSIS — K7011 Alcoholic hepatitis with ascites: Secondary | ICD-10-CM | POA: Diagnosis not present

## 2022-08-26 DIAGNOSIS — R059 Cough, unspecified: Secondary | ICD-10-CM | POA: Diagnosis not present

## 2022-08-26 DIAGNOSIS — K4090 Unilateral inguinal hernia, without obstruction or gangrene, not specified as recurrent: Secondary | ICD-10-CM | POA: Diagnosis not present

## 2022-08-26 DIAGNOSIS — K7460 Unspecified cirrhosis of liver: Secondary | ICD-10-CM | POA: Diagnosis not present

## 2022-08-26 DIAGNOSIS — K7290 Hepatic failure, unspecified without coma: Secondary | ICD-10-CM | POA: Diagnosis not present

## 2022-08-26 DIAGNOSIS — R1010 Upper abdominal pain, unspecified: Secondary | ICD-10-CM | POA: Diagnosis not present

## 2022-08-26 DIAGNOSIS — I8510 Secondary esophageal varices without bleeding: Secondary | ICD-10-CM | POA: Diagnosis not present

## 2022-08-26 DIAGNOSIS — R000 Tachycardia, unspecified: Secondary | ICD-10-CM | POA: Diagnosis not present

## 2022-08-26 DIAGNOSIS — K429 Umbilical hernia without obstruction or gangrene: Secondary | ICD-10-CM | POA: Diagnosis not present

## 2022-08-26 DIAGNOSIS — K7031 Alcoholic cirrhosis of liver with ascites: Secondary | ICD-10-CM | POA: Diagnosis not present

## 2022-08-26 DIAGNOSIS — E440 Moderate protein-calorie malnutrition: Secondary | ICD-10-CM | POA: Diagnosis not present

## 2022-08-26 DIAGNOSIS — I517 Cardiomegaly: Secondary | ICD-10-CM | POA: Diagnosis not present

## 2022-08-27 DIAGNOSIS — I517 Cardiomegaly: Secondary | ICD-10-CM | POA: Diagnosis not present

## 2022-08-27 DIAGNOSIS — C641 Malignant neoplasm of right kidney, except renal pelvis: Secondary | ICD-10-CM | POA: Diagnosis not present

## 2022-08-27 DIAGNOSIS — R109 Unspecified abdominal pain: Secondary | ICD-10-CM | POA: Diagnosis not present

## 2022-08-27 DIAGNOSIS — Z9889 Other specified postprocedural states: Secondary | ICD-10-CM | POA: Diagnosis not present

## 2022-08-27 DIAGNOSIS — D61818 Other pancytopenia: Secondary | ICD-10-CM | POA: Diagnosis not present

## 2022-08-27 DIAGNOSIS — K7031 Alcoholic cirrhosis of liver with ascites: Secondary | ICD-10-CM | POA: Diagnosis not present

## 2022-08-27 DIAGNOSIS — K5900 Constipation, unspecified: Secondary | ICD-10-CM | POA: Diagnosis not present

## 2022-08-27 DIAGNOSIS — K7290 Hepatic failure, unspecified without coma: Secondary | ICD-10-CM | POA: Diagnosis not present

## 2022-08-27 DIAGNOSIS — K7460 Unspecified cirrhosis of liver: Secondary | ICD-10-CM | POA: Diagnosis not present

## 2022-08-27 NOTE — Telephone Encounter (Signed)
Not prescribed by PCP;

## 2022-08-28 ENCOUNTER — Ambulatory Visit: Payer: Medicaid Other | Admitting: Oncology

## 2022-08-28 DIAGNOSIS — R1010 Upper abdominal pain, unspecified: Secondary | ICD-10-CM | POA: Diagnosis not present

## 2022-08-29 ENCOUNTER — Telehealth: Payer: Self-pay

## 2022-08-29 NOTE — Transitions of Care (Post Inpatient/ED Visit) (Signed)
   08/29/2022  Name: Steave Latronica MRN: [id number] DOB: [birth year]  Today's TOC FU Call Status: Today's TOC FU Call Status:: Successful TOC FU Call Completed TOC FU Call Complete Date: 08/29/22 Patient's Name and Date of Birth confirmed.  Transition Care Management Follow-up Telephone Call Date of Discharge: 08/28/22 Discharge Facility: Other ([facility] Facility) Name of Other ([facility]) Discharge Facility: [facility] Type of Discharge: Inpatient Admission Primary Inpatient Discharge Diagnosis:: ascites How have you been since you were released from the hospital?: Better Any questions or concerns?: Yes Patient Questions/Concerns:: Patient reports that he changed PCP to [facility] and does not know who his MD is. Patient Questions/Concerns Addressed: Other: ASSESSMENT NOT COMPLETED DUE TO NON [facility]. Patient changed PCPs.  EPIC updated. Items Reviewed: Medications obtained,verified, and reconciled?: No  Medications Reviewed Today: Medications Reviewed Today   Medications were not reviewed in this encounter       Follow up appointments reviewed: PCP Follow-up appointment confirmed?: Yes Date of PCP follow-up appointment?: 09/13/22 Follow-up Provider: Archie Endo  Patient informed of appointment and MD name.    Rowe Pavy, RN, BSN, CEN [facility] [phone]

## 2022-08-30 ENCOUNTER — Telehealth: Payer: Self-pay | Admitting: Oncology

## 2022-08-30 DIAGNOSIS — C641 Malignant neoplasm of right kidney, except renal pelvis: Secondary | ICD-10-CM | POA: Diagnosis not present

## 2022-08-30 DIAGNOSIS — Z1211 Encounter for screening for malignant neoplasm of colon: Secondary | ICD-10-CM | POA: Diagnosis not present

## 2022-08-30 DIAGNOSIS — D61818 Other pancytopenia: Secondary | ICD-10-CM | POA: Diagnosis not present

## 2022-08-30 DIAGNOSIS — K766 Portal hypertension: Secondary | ICD-10-CM | POA: Diagnosis not present

## 2022-08-30 DIAGNOSIS — R188 Other ascites: Secondary | ICD-10-CM | POA: Diagnosis not present

## 2022-08-30 DIAGNOSIS — D509 Iron deficiency anemia, unspecified: Secondary | ICD-10-CM | POA: Diagnosis not present

## 2022-08-30 DIAGNOSIS — E119 Type 2 diabetes mellitus without complications: Secondary | ICD-10-CM | POA: Diagnosis not present

## 2022-08-30 DIAGNOSIS — M059 Rheumatoid arthritis with rheumatoid factor, unspecified: Secondary | ICD-10-CM | POA: Diagnosis not present

## 2022-08-30 DIAGNOSIS — K7469 Other cirrhosis of liver: Secondary | ICD-10-CM | POA: Diagnosis not present

## 2022-08-30 DIAGNOSIS — N2889 Other specified disorders of kidney and ureter: Secondary | ICD-10-CM | POA: Diagnosis not present

## 2022-08-30 NOTE — Telephone Encounter (Signed)
This patient left a voicemail to cancel Sept 3 appointment. He states he was in the hospitla and they gave him iron there.  I have cancelled and left him a message to call back to reschedule when he is ready.

## 2022-08-31 NOTE — Telephone Encounter (Signed)
We could repeat cbc ferritin and iron studies 6 weeks from now and see me thereafter

## 2022-09-02 DIAGNOSIS — Z419 Encounter for procedure for purposes other than remedying health state, unspecified: Secondary | ICD-10-CM | POA: Diagnosis not present

## 2022-09-04 ENCOUNTER — Ambulatory Visit: Payer: Medicaid Other

## 2022-09-04 ENCOUNTER — Inpatient Hospital Stay: Payer: Medicaid Other

## 2022-09-04 ENCOUNTER — Other Ambulatory Visit: Payer: Self-pay | Admitting: *Deleted

## 2022-09-04 ENCOUNTER — Inpatient Hospital Stay: Payer: Medicaid Other | Attending: Oncology | Admitting: Oncology

## 2022-09-04 DIAGNOSIS — M059 Rheumatoid arthritis with rheumatoid factor, unspecified: Secondary | ICD-10-CM | POA: Diagnosis not present

## 2022-09-04 DIAGNOSIS — D508 Other iron deficiency anemias: Secondary | ICD-10-CM

## 2022-09-05 ENCOUNTER — Telehealth: Payer: Self-pay

## 2022-09-07 DIAGNOSIS — K7031 Alcoholic cirrhosis of liver with ascites: Secondary | ICD-10-CM | POA: Diagnosis not present

## 2022-09-10 ENCOUNTER — Telehealth: Payer: Self-pay | Admitting: *Deleted

## 2022-09-21 ENCOUNTER — Telehealth: Payer: Self-pay

## 2022-09-24 DIAGNOSIS — K8020 Calculus of gallbladder without cholecystitis without obstruction: Secondary | ICD-10-CM | POA: Diagnosis not present

## 2022-09-24 DIAGNOSIS — K4090 Unilateral inguinal hernia, without obstruction or gangrene, not specified as recurrent: Secondary | ICD-10-CM | POA: Diagnosis not present

## 2022-09-24 DIAGNOSIS — K7460 Unspecified cirrhosis of liver: Secondary | ICD-10-CM | POA: Diagnosis not present

## 2022-09-24 DIAGNOSIS — K429 Umbilical hernia without obstruction or gangrene: Secondary | ICD-10-CM | POA: Diagnosis not present

## 2022-09-24 DIAGNOSIS — C641 Malignant neoplasm of right kidney, except renal pelvis: Secondary | ICD-10-CM | POA: Diagnosis not present

## 2022-09-27 DIAGNOSIS — K439 Ventral hernia without obstruction or gangrene: Secondary | ICD-10-CM | POA: Diagnosis not present

## 2022-09-30 DIAGNOSIS — C641 Malignant neoplasm of right kidney, except renal pelvis: Secondary | ICD-10-CM | POA: Diagnosis not present

## 2022-09-30 DIAGNOSIS — R188 Other ascites: Secondary | ICD-10-CM | POA: Diagnosis not present

## 2022-09-30 DIAGNOSIS — E1165 Type 2 diabetes mellitus with hyperglycemia: Secondary | ICD-10-CM | POA: Diagnosis not present

## 2022-09-30 DIAGNOSIS — K766 Portal hypertension: Secondary | ICD-10-CM | POA: Diagnosis not present

## 2022-09-30 DIAGNOSIS — N2889 Other specified disorders of kidney and ureter: Secondary | ICD-10-CM | POA: Diagnosis not present

## 2022-09-30 DIAGNOSIS — M069 Rheumatoid arthritis, unspecified: Secondary | ICD-10-CM | POA: Diagnosis not present

## 2022-09-30 DIAGNOSIS — Z7984 Long term (current) use of oral hypoglycemic drugs: Secondary | ICD-10-CM | POA: Diagnosis not present

## 2022-09-30 DIAGNOSIS — K7469 Other cirrhosis of liver: Secondary | ICD-10-CM | POA: Diagnosis not present

## 2022-10-01 DIAGNOSIS — R188 Other ascites: Secondary | ICD-10-CM | POA: Diagnosis not present

## 2022-10-01 DIAGNOSIS — K7460 Unspecified cirrhosis of liver: Secondary | ICD-10-CM | POA: Diagnosis not present

## 2022-10-02 DIAGNOSIS — R188 Other ascites: Secondary | ICD-10-CM | POA: Diagnosis not present

## 2022-10-02 DIAGNOSIS — D49511 Neoplasm of unspecified behavior of right kidney: Secondary | ICD-10-CM | POA: Diagnosis not present

## 2022-10-02 DIAGNOSIS — Z419 Encounter for procedure for purposes other than remedying health state, unspecified: Secondary | ICD-10-CM | POA: Diagnosis not present

## 2022-10-02 DIAGNOSIS — Z9889 Other specified postprocedural states: Secondary | ICD-10-CM | POA: Diagnosis not present

## 2022-10-02 DIAGNOSIS — C641 Malignant neoplasm of right kidney, except renal pelvis: Secondary | ICD-10-CM | POA: Diagnosis not present

## 2022-10-03 ENCOUNTER — Telehealth: Payer: Self-pay

## 2022-10-03 DIAGNOSIS — Z7984 Long term (current) use of oral hypoglycemic drugs: Secondary | ICD-10-CM | POA: Diagnosis not present

## 2022-10-03 DIAGNOSIS — E1165 Type 2 diabetes mellitus with hyperglycemia: Secondary | ICD-10-CM | POA: Diagnosis not present

## 2022-10-03 DIAGNOSIS — R188 Other ascites: Secondary | ICD-10-CM | POA: Diagnosis not present

## 2022-10-03 DIAGNOSIS — K7460 Unspecified cirrhosis of liver: Secondary | ICD-10-CM | POA: Diagnosis not present

## 2022-10-03 DIAGNOSIS — T380X5A Adverse effect of glucocorticoids and synthetic analogues, initial encounter: Secondary | ICD-10-CM | POA: Diagnosis not present

## 2022-10-04 DIAGNOSIS — Z7984 Long term (current) use of oral hypoglycemic drugs: Secondary | ICD-10-CM | POA: Diagnosis not present

## 2022-10-04 DIAGNOSIS — T380X5A Adverse effect of glucocorticoids and synthetic analogues, initial encounter: Secondary | ICD-10-CM | POA: Diagnosis not present

## 2022-10-04 DIAGNOSIS — E1165 Type 2 diabetes mellitus with hyperglycemia: Secondary | ICD-10-CM | POA: Diagnosis not present

## 2022-10-08 ENCOUNTER — Telehealth: Payer: Self-pay

## 2022-10-08 NOTE — Transitions of Care (Post Inpatient/ED Visit) (Signed)
10/08/2022  Name: Cole Pierce MRN: [id number] DOB: [birth year]  Today's TOC FU Call Status: Today's TOC FU Call Status:: Unsuccessful Call (1st Attempt) Unsuccessful Call (1st Attempt) Date: 10/08/22  Attempted to reach the patient regarding the most recent Inpatient/ED visit.  Follow Up Plan: Additional outreach attempts will be made to reach the patient to complete the Transitions of Care (Post Inpatient/ED visit) call.   Alyse Low, RN, BA, [facility], CRRN [facility] Coordinator, Transition of Care Ph # [phone]

## 2022-10-09 ENCOUNTER — Telehealth: Payer: Self-pay

## 2022-10-09 NOTE — Transitions of Care (Post Inpatient/ED Visit) (Signed)
10/09/2022  Name: Cole Pierce MRN: [id number] DOB: August 23, 1965  Today's TOC FU Call Status: Today's TOC FU Call Status:: Unsuccessful Call (2nd Attempt) Unsuccessful Call (2nd Attempt) Date: 10/09/22  Attempted to reach the patient regarding the most recent Inpatient/ED visit.  Follow Up Plan: Additional outreach attempts will be made to reach the patient to complete the Transitions of Care (Post Inpatient/ED visit) call.   Alyse Low, RN, BA, [facility], CRRN [facility] Coordinator, Transition of Care Ph # [phone]

## 2022-10-10 ENCOUNTER — Telehealth: Payer: Self-pay

## 2022-10-10 NOTE — Transitions of Care (Post Inpatient/ED Visit) (Signed)
10/10/2022  Name: Lakendrick Tomaselli MRN: [id number] DOB: 1965/05/19  Today's TOC FU Call Status: Today's TOC FU Call Status:: Unsuccessful Call (3rd Attempt) Unsuccessful Call (3rd Attempt) Date: 10/10/22  Attempted to reach the patient regarding the most recent Inpatient/ED visit.  Follow Up Plan: No further outreach attempts will be made at this time. We have been unable to contact the patient.  Alyse Low, RN, BA, [facility], CRRN [facility] Coordinator, Transition of Care Ph # [phone]

## 2022-10-16 ENCOUNTER — Ambulatory Visit: Admission: RE | Admit: 2022-10-16 | Payer: Medicaid Other | Source: Home / Self Care | Admitting: Internal Medicine

## 2022-10-16 SURGERY — COLONOSCOPY WITH PROPOFOL
Anesthesia: General

## 2022-10-17 ENCOUNTER — Inpatient Hospital Stay: Payer: Medicaid Other | Attending: Oncology

## 2022-10-17 ENCOUNTER — Inpatient Hospital Stay: Payer: Medicaid Other | Admitting: Oncology

## 2022-10-31 DIAGNOSIS — Z09 Encounter for follow-up examination after completed treatment for conditions other than malignant neoplasm: Secondary | ICD-10-CM | POA: Diagnosis not present

## 2022-10-31 DIAGNOSIS — C641 Malignant neoplasm of right kidney, except renal pelvis: Secondary | ICD-10-CM | POA: Diagnosis not present

## 2022-11-02 DIAGNOSIS — Z419 Encounter for procedure for purposes other than remedying health state, unspecified: Secondary | ICD-10-CM | POA: Diagnosis not present

## 2022-11-05 DIAGNOSIS — R188 Other ascites: Secondary | ICD-10-CM | POA: Diagnosis not present

## 2022-11-05 DIAGNOSIS — K769 Liver disease, unspecified: Secondary | ICD-10-CM | POA: Diagnosis not present

## 2022-11-05 DIAGNOSIS — K766 Portal hypertension: Secondary | ICD-10-CM | POA: Diagnosis not present

## 2022-11-05 DIAGNOSIS — K7469 Other cirrhosis of liver: Secondary | ICD-10-CM | POA: Diagnosis not present

## 2022-11-05 DIAGNOSIS — K429 Umbilical hernia without obstruction or gangrene: Secondary | ICD-10-CM | POA: Diagnosis not present

## 2022-11-05 DIAGNOSIS — K4090 Unilateral inguinal hernia, without obstruction or gangrene, not specified as recurrent: Secondary | ICD-10-CM | POA: Diagnosis not present

## 2022-11-15 DIAGNOSIS — K7460 Unspecified cirrhosis of liver: Secondary | ICD-10-CM | POA: Diagnosis not present

## 2022-11-15 DIAGNOSIS — Z2821 Immunization not carried out because of patient refusal: Secondary | ICD-10-CM | POA: Diagnosis not present

## 2022-11-15 DIAGNOSIS — M069 Rheumatoid arthritis, unspecified: Secondary | ICD-10-CM | POA: Diagnosis not present

## 2022-11-15 DIAGNOSIS — E119 Type 2 diabetes mellitus without complications: Secondary | ICD-10-CM | POA: Diagnosis not present

## 2022-11-15 DIAGNOSIS — K429 Umbilical hernia without obstruction or gangrene: Secondary | ICD-10-CM | POA: Diagnosis not present

## 2022-11-15 DIAGNOSIS — C641 Malignant neoplasm of right kidney, except renal pelvis: Secondary | ICD-10-CM | POA: Diagnosis not present

## 2022-12-02 DIAGNOSIS — Z419 Encounter for procedure for purposes other than remedying health state, unspecified: Secondary | ICD-10-CM | POA: Diagnosis not present

## 2022-12-06 DIAGNOSIS — K648 Other hemorrhoids: Secondary | ICD-10-CM | POA: Diagnosis not present

## 2022-12-06 DIAGNOSIS — K5520 Angiodysplasia of colon without hemorrhage: Secondary | ICD-10-CM | POA: Diagnosis not present

## 2022-12-06 DIAGNOSIS — K7460 Unspecified cirrhosis of liver: Secondary | ICD-10-CM | POA: Diagnosis not present

## 2022-12-06 DIAGNOSIS — K31819 Angiodysplasia of stomach and duodenum without bleeding: Secondary | ICD-10-CM | POA: Diagnosis not present

## 2022-12-06 DIAGNOSIS — D509 Iron deficiency anemia, unspecified: Secondary | ICD-10-CM | POA: Diagnosis not present

## 2022-12-06 DIAGNOSIS — I8510 Secondary esophageal varices without bleeding: Secondary | ICD-10-CM | POA: Diagnosis not present

## 2022-12-07 ENCOUNTER — Telehealth: Payer: Self-pay

## 2023-01-02 DIAGNOSIS — Z419 Encounter for procedure for purposes other than remedying health state, unspecified: Secondary | ICD-10-CM | POA: Diagnosis not present

## 2023-01-14 DIAGNOSIS — I864 Gastric varices: Secondary | ICD-10-CM | POA: Diagnosis not present

## 2023-01-14 DIAGNOSIS — R188 Other ascites: Secondary | ICD-10-CM | POA: Diagnosis not present

## 2023-01-14 DIAGNOSIS — K8020 Calculus of gallbladder without cholecystitis without obstruction: Secondary | ICD-10-CM | POA: Diagnosis not present

## 2023-01-14 DIAGNOSIS — J90 Pleural effusion, not elsewhere classified: Secondary | ICD-10-CM | POA: Diagnosis not present

## 2023-01-14 DIAGNOSIS — C220 Liver cell carcinoma: Secondary | ICD-10-CM | POA: Diagnosis not present

## 2023-01-14 DIAGNOSIS — R161 Splenomegaly, not elsewhere classified: Secondary | ICD-10-CM | POA: Diagnosis not present

## 2023-01-14 DIAGNOSIS — K769 Liver disease, unspecified: Secondary | ICD-10-CM | POA: Diagnosis not present

## 2023-01-14 DIAGNOSIS — K7469 Other cirrhosis of liver: Secondary | ICD-10-CM | POA: Diagnosis not present

## 2023-01-30 DIAGNOSIS — K3189 Other diseases of stomach and duodenum: Secondary | ICD-10-CM | POA: Diagnosis not present

## 2023-01-30 DIAGNOSIS — I8510 Secondary esophageal varices without bleeding: Secondary | ICD-10-CM | POA: Diagnosis not present

## 2023-01-30 DIAGNOSIS — I8500 Esophageal varices without bleeding: Secondary | ICD-10-CM | POA: Diagnosis not present

## 2023-01-30 DIAGNOSIS — K31819 Angiodysplasia of stomach and duodenum without bleeding: Secondary | ICD-10-CM | POA: Diagnosis not present

## 2023-01-30 DIAGNOSIS — K766 Portal hypertension: Secondary | ICD-10-CM | POA: Diagnosis not present

## 2023-01-30 DIAGNOSIS — K259 Gastric ulcer, unspecified as acute or chronic, without hemorrhage or perforation: Secondary | ICD-10-CM | POA: Diagnosis not present

## 2023-02-02 DIAGNOSIS — Z419 Encounter for procedure for purposes other than remedying health state, unspecified: Secondary | ICD-10-CM | POA: Diagnosis not present

## 2023-03-02 DIAGNOSIS — Z419 Encounter for procedure for purposes other than remedying health state, unspecified: Secondary | ICD-10-CM | POA: Diagnosis not present

## 2023-04-09 IMAGING — CT CT ABDOMEN W/O CM
2 of 4 series · 16 of 46 positions shown, 18 images · non-contrast
Comparison: None.

CLINICAL DATA: Right inguinal pain.  Abdominal pain.  Hernia.



[Series 2: routine abd/pel wo · axial · 0.79mm/px · z∈[-486,-176]mm · 13 of 70 slices shown, 15 images]
[im 4/70  soft-tissue]
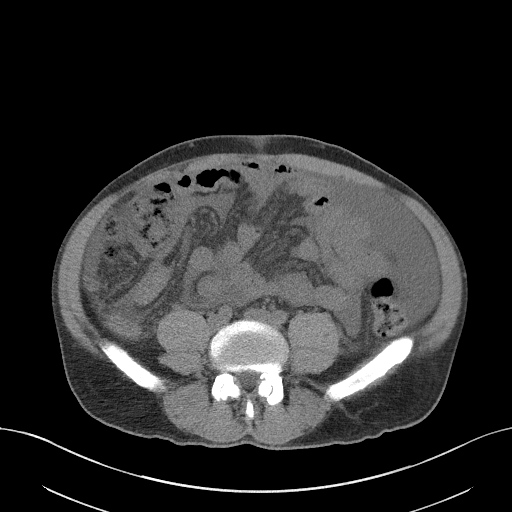
[im 4/70  bone]
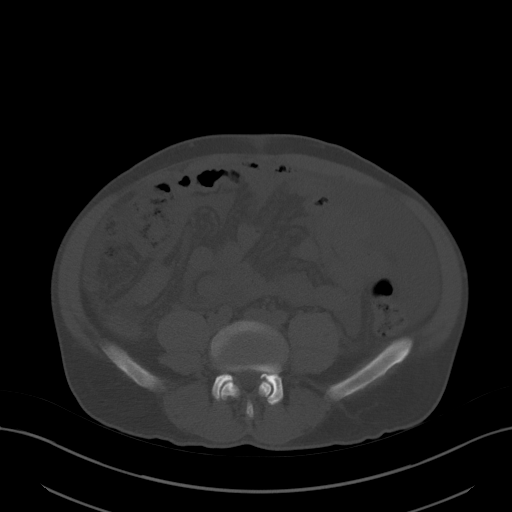
[im 10/70  soft-tissue]
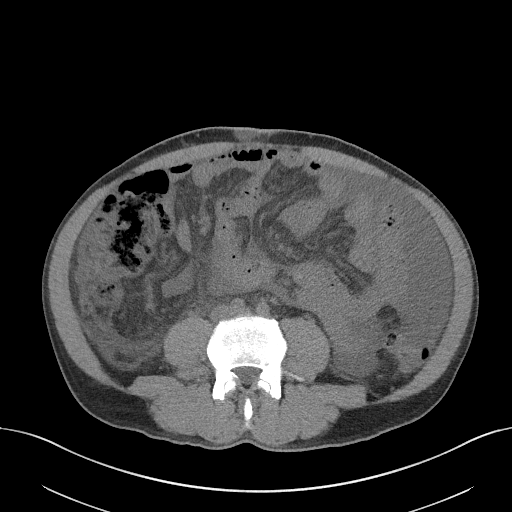
[im 14/70  soft-tissue]
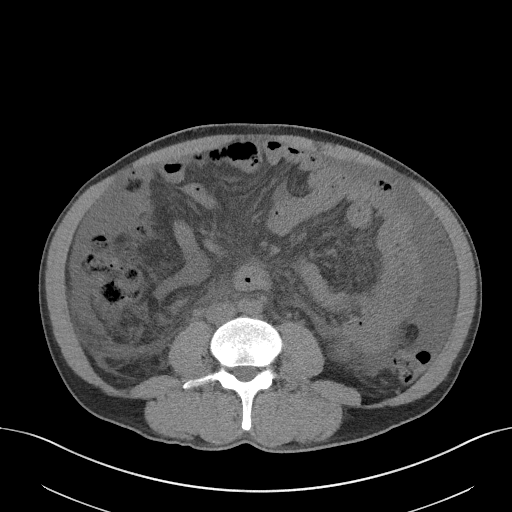
[im 20/70  soft-tissue]
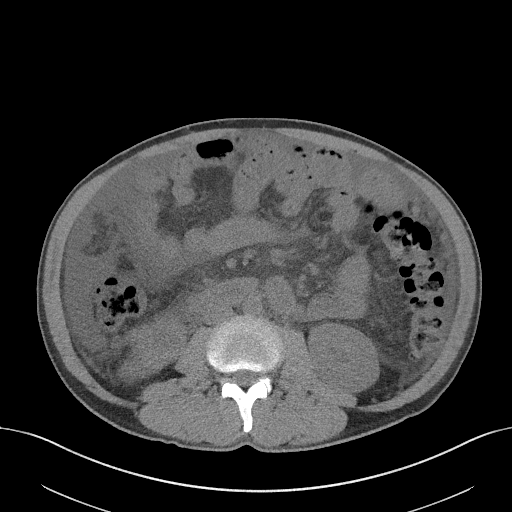
[im 24/70  soft-tissue]
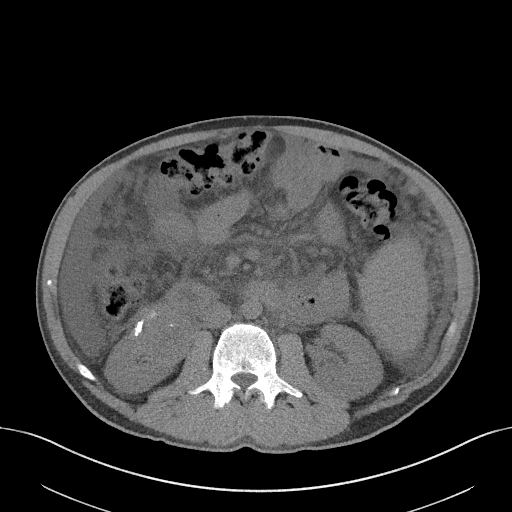
[im 30/70  soft-tissue]
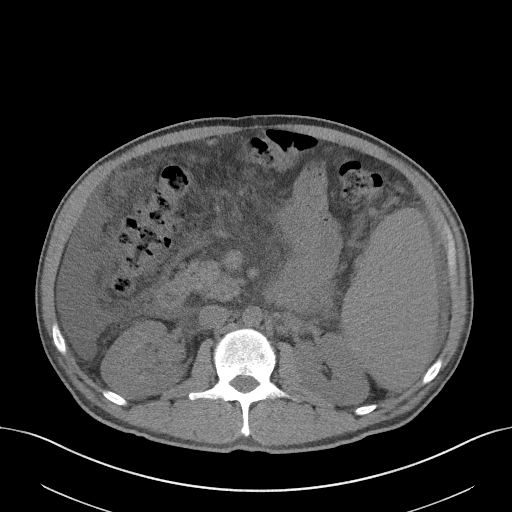
[im 37/70  soft-tissue]
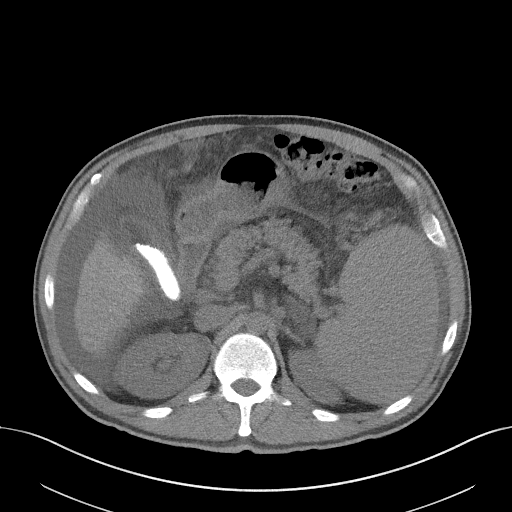
[im 40/70  soft-tissue]
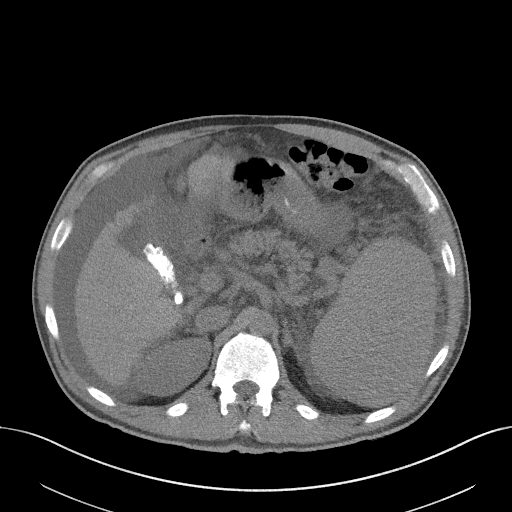
[im 47/70  soft-tissue]
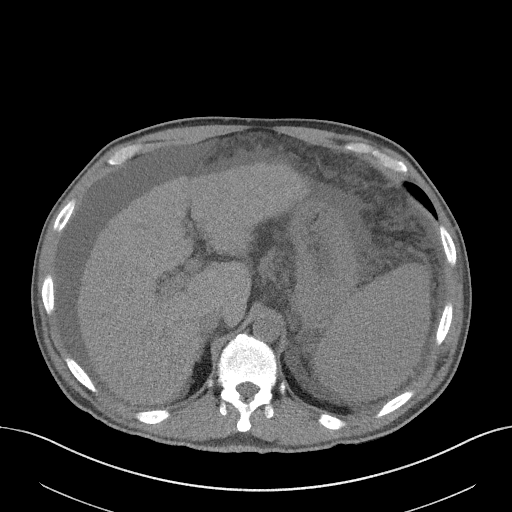
[im 47/70  bone]
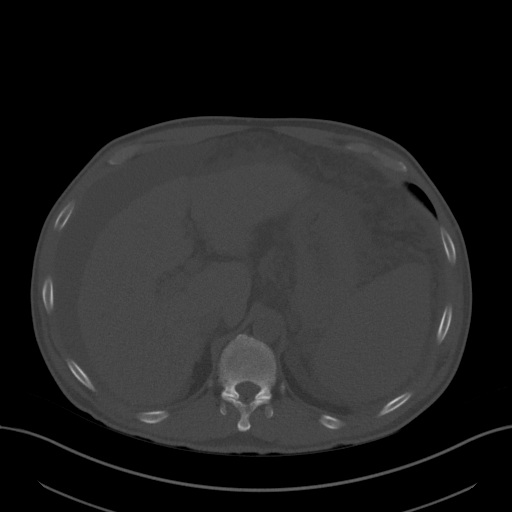
[im 50/70  soft-tissue]
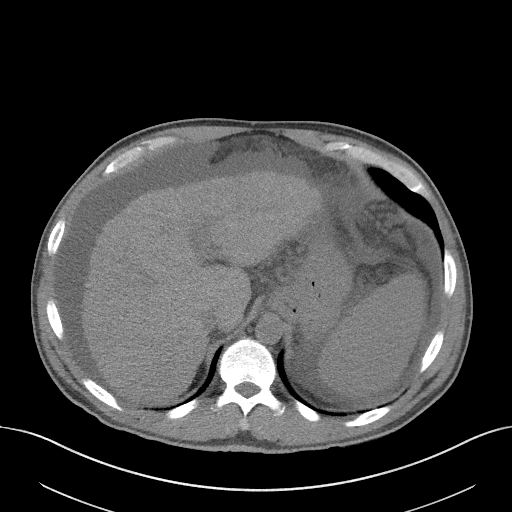
[im 56/70  soft-tissue]
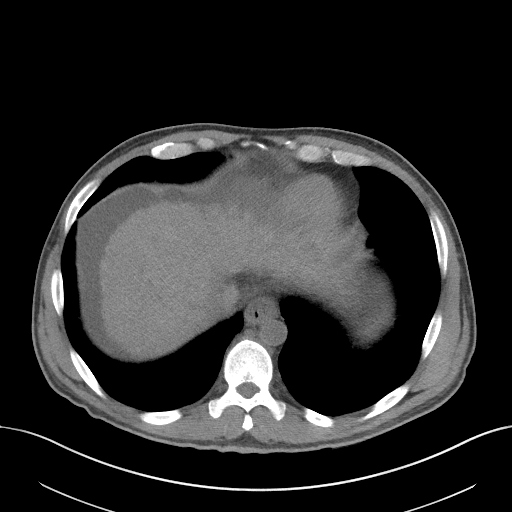
[im 60/70  soft-tissue]
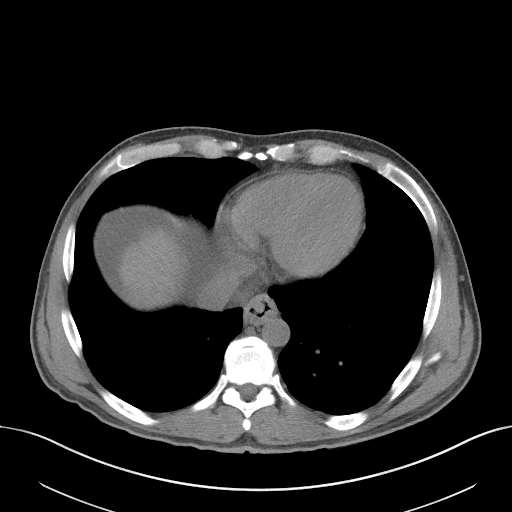
[im 66/70  soft-tissue]
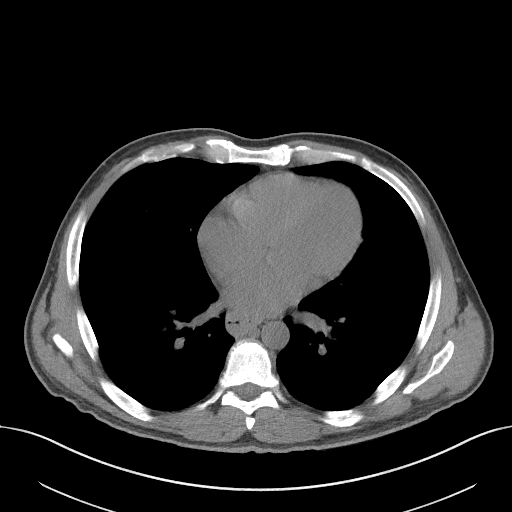

[Series 5: coronal st · coronal · 0.70mm/px · 3 of 91 slices shown]
[im 31/91  soft-tissue]
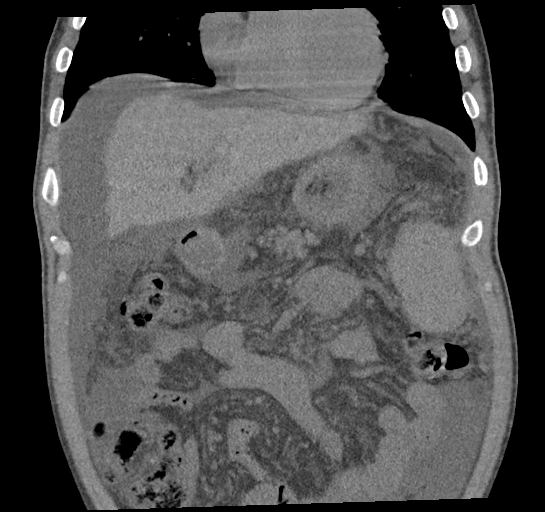
[im 41/91  soft-tissue]
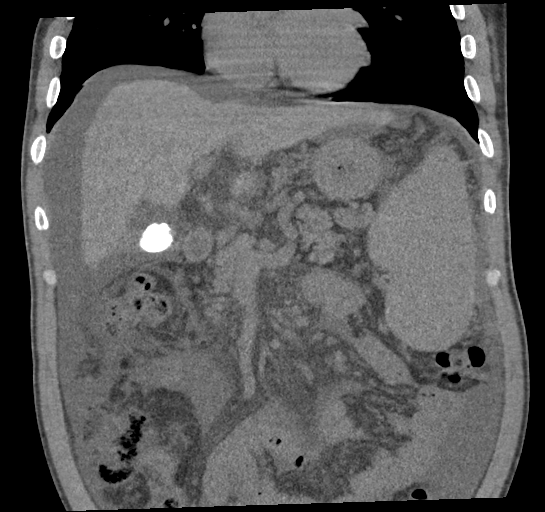
[im 51/91  soft-tissue]
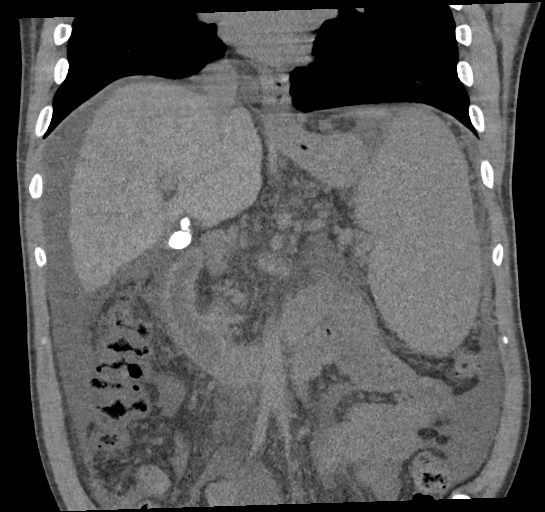

[16 of 46 positions shown; findings below may reference images not displayed]

FINDINGS: Lower chest: No acute abnormality.

Hepatobiliary: Liver is diffusely nodular with inhomogeneous
parenchyma consistent with cirrhosis. No definite focal mass
identified in the liver. Gallbladder is nearly completely filled
with calcified stones. There is mild gallbladder wall thickening
most likely chronic. No biliary ductal dilatation appreciated.

Pancreas: Unremarkable. No pancreatic ductal dilatation or
surrounding inflammatory changes.

Spleen: Markedly enlarged measuring up to 17.4 cm in length.

Adrenals/Urinary Tract: Adrenal glands appear normal. There is an
approximately 5 x 3.5 cm bilobed appearing or adjacent lesions in
the anterior lower pole the right kidney with the appearance of
septations and partial calcifications, not well visualized without
contrast. Otherwise no nephrolithiasis or hydronephrosis visualized
bilaterally. Urinary bladder appears within normal limits.

Stomach/Bowel: Mild wall thickening of the visualized distal
esophagus. No bowel obstruction, free air or pneumatosis. Moderate
amount of retained fecal material throughout the colon. No bowel
wall edema identified. No evidence of acute appendicitis.

Vascular/Lymphatic: Small varicosities throughout the upper abdomen.
No bulky lymphadenopathy identified in the abdomen or pelvis.

Reproductive: Prostate gland unremarkable.

Other: Large volume ascites. Right inguinal hernia which contains
ascitic fluid.

Musculoskeletal: No acute or significant osseous findings.
IMPRESSION: 1. Hepatic cirrhosis.
2. Marked splenomegaly and small upper abdominal varices, consistent
with portal hypertension.
3. Cholelithiasis.
4. Complex mass in the lower pole the right kidney as described, not
well evaluated without contrast. Recommend follow-up nonemergent
evaluation with ultrasound and/or renal mass protocol CT or MRI.
5. Large volume ascites.
6. Right inguinal hernia which contains ascites.
7. Mild wall thickening throughout the visualized distal esophagus,
correlate clinically and follow-up as indicated.

## 2023-04-13 DIAGNOSIS — Z419 Encounter for procedure for purposes other than remedying health state, unspecified: Secondary | ICD-10-CM | POA: Diagnosis not present

## 2023-04-25 DIAGNOSIS — I8500 Esophageal varices without bleeding: Secondary | ICD-10-CM | POA: Diagnosis not present

## 2023-04-25 DIAGNOSIS — K3189 Other diseases of stomach and duodenum: Secondary | ICD-10-CM | POA: Diagnosis not present

## 2023-04-25 DIAGNOSIS — I8510 Secondary esophageal varices without bleeding: Secondary | ICD-10-CM | POA: Diagnosis not present

## 2023-04-25 DIAGNOSIS — K7460 Unspecified cirrhosis of liver: Secondary | ICD-10-CM | POA: Diagnosis not present

## 2023-04-25 DIAGNOSIS — K259 Gastric ulcer, unspecified as acute or chronic, without hemorrhage or perforation: Secondary | ICD-10-CM | POA: Diagnosis not present

## 2023-04-25 DIAGNOSIS — K766 Portal hypertension: Secondary | ICD-10-CM | POA: Diagnosis not present

## 2023-05-01 DIAGNOSIS — J90 Pleural effusion, not elsewhere classified: Secondary | ICD-10-CM | POA: Diagnosis not present

## 2023-05-01 DIAGNOSIS — Z85528 Personal history of other malignant neoplasm of kidney: Secondary | ICD-10-CM | POA: Diagnosis not present

## 2023-05-01 DIAGNOSIS — K4090 Unilateral inguinal hernia, without obstruction or gangrene, not specified as recurrent: Secondary | ICD-10-CM | POA: Diagnosis not present

## 2023-05-01 DIAGNOSIS — Z08 Encounter for follow-up examination after completed treatment for malignant neoplasm: Secondary | ICD-10-CM | POA: Diagnosis not present

## 2023-05-01 DIAGNOSIS — K429 Umbilical hernia without obstruction or gangrene: Secondary | ICD-10-CM | POA: Diagnosis not present

## 2023-05-01 DIAGNOSIS — K766 Portal hypertension: Secondary | ICD-10-CM | POA: Diagnosis not present

## 2023-05-01 DIAGNOSIS — K7469 Other cirrhosis of liver: Secondary | ICD-10-CM | POA: Diagnosis not present

## 2023-05-01 DIAGNOSIS — C641 Malignant neoplasm of right kidney, except renal pelvis: Secondary | ICD-10-CM | POA: Diagnosis not present

## 2023-05-01 DIAGNOSIS — I2510 Atherosclerotic heart disease of native coronary artery without angina pectoris: Secondary | ICD-10-CM | POA: Diagnosis not present

## 2023-05-01 DIAGNOSIS — R161 Splenomegaly, not elsewhere classified: Secondary | ICD-10-CM | POA: Diagnosis not present

## 2023-05-13 DIAGNOSIS — Z419 Encounter for procedure for purposes other than remedying health state, unspecified: Secondary | ICD-10-CM | POA: Diagnosis not present

## 2023-05-13 IMAGING — CT CT ABDOMEN WO/W CM
3 of 13 series · 12 of 46 positions shown, 18 images · IV contrast (agent unspecified)
Comparison: CT January 18, 2021

CLINICAL DATA: Further evaluation of right renal lesion seen on
prior CT.

EXAM:
CT ABDOMEN WITHOUT AND WITH CONTRAST
TECHNIQUE: Multidetector CT imaging of the abdomen was performed following the
standard protocol before and following the bolus administration of
intravenous contrast.

[Series 3: coronal pre · coronal · non-contrast · 0.69mm/px · 2 of 90 slices shown, 3 images]
[im 30/90  soft-tissue]
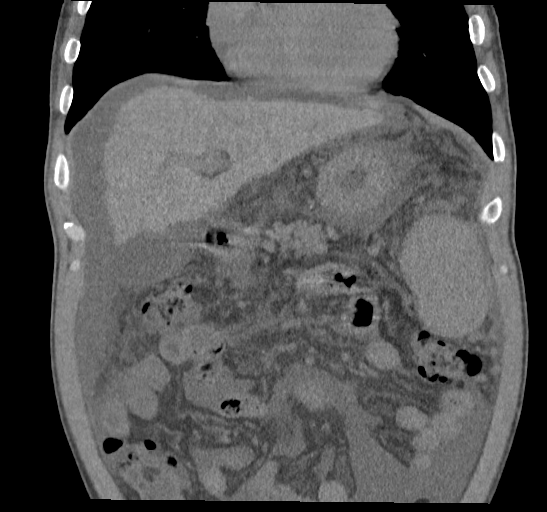
[im 30/90  bone]
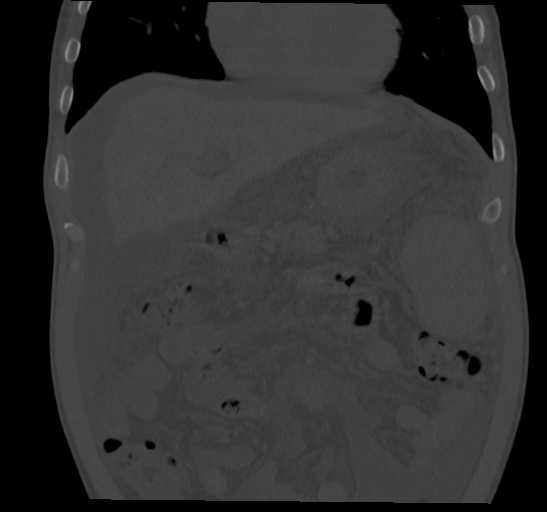
[im 60/90  soft-tissue]
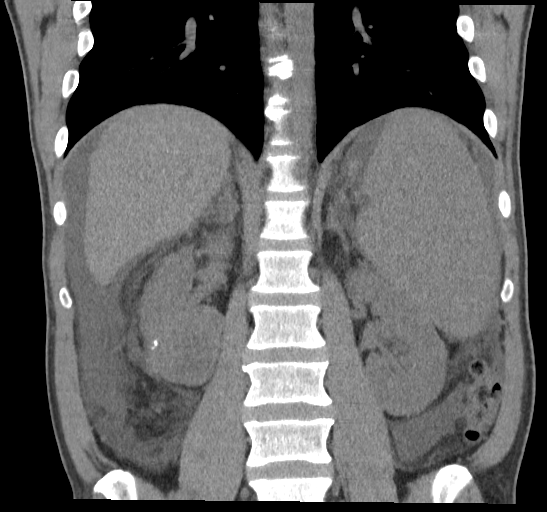

[Series 6: axial arterial · axial · arterial · 0.73mm/px · z∈[-716,-470]mm · 6 of 116 slices shown, 11 images]
[im 17/116  soft-tissue]
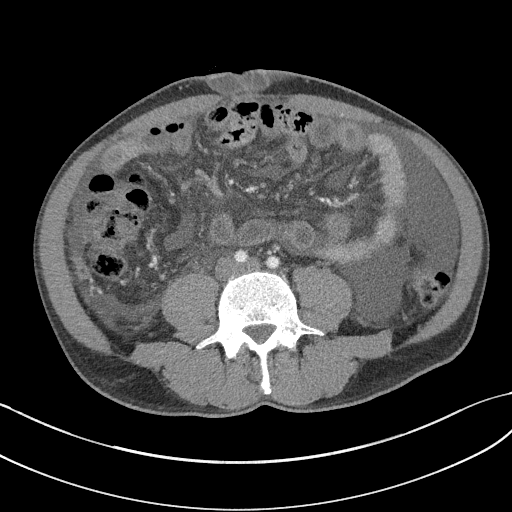
[im 17/116  bone]
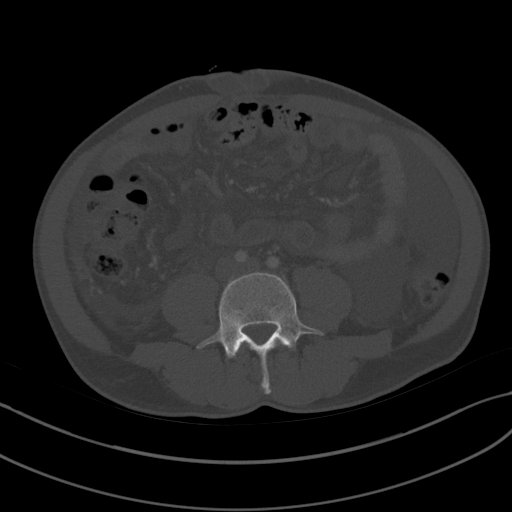
[im 33/116  soft-tissue]
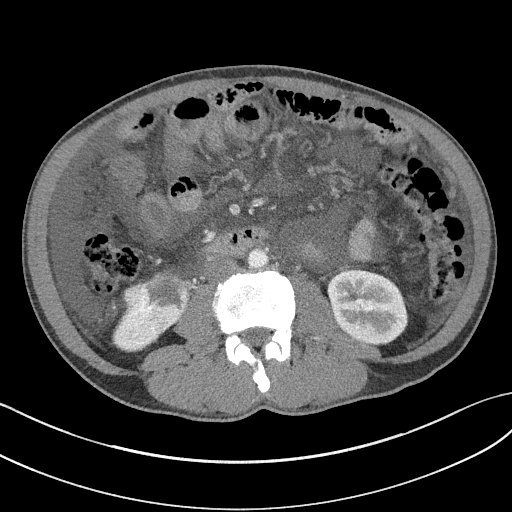
[im 50/116  soft-tissue]
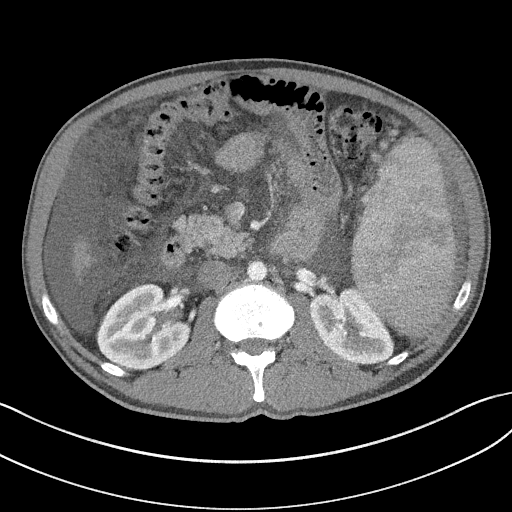
[im 50/116  lung]
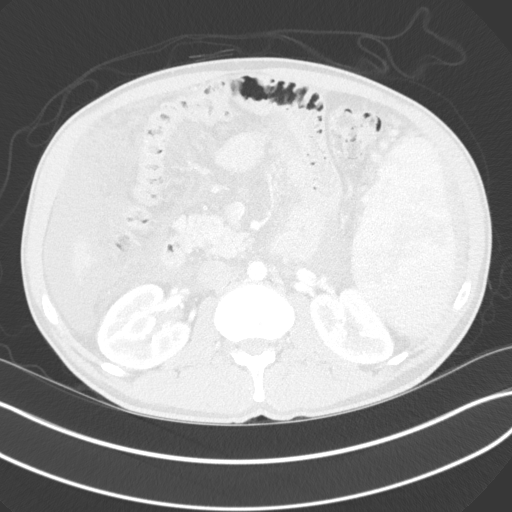
[im 66/116  soft-tissue]
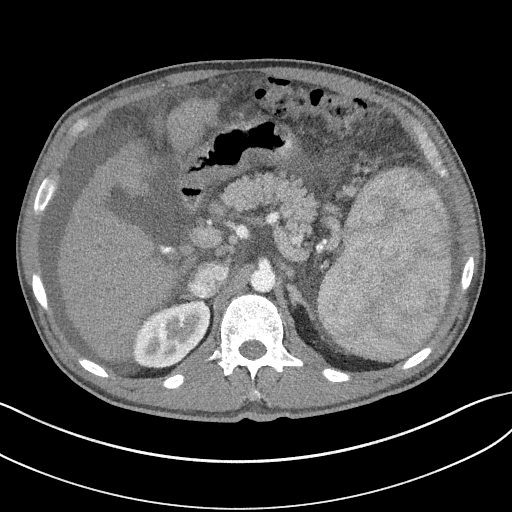
[im 66/116  lung]
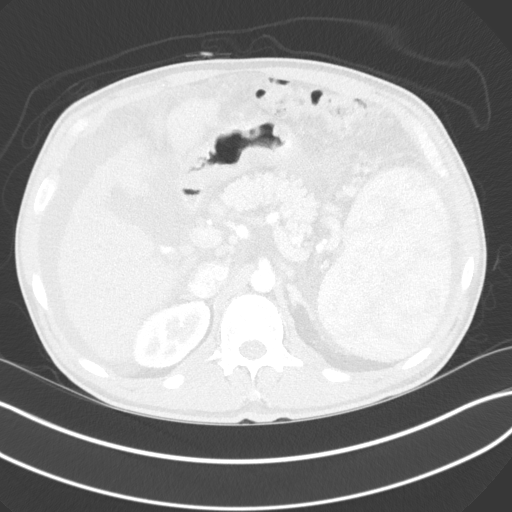
[im 83/116  soft-tissue]
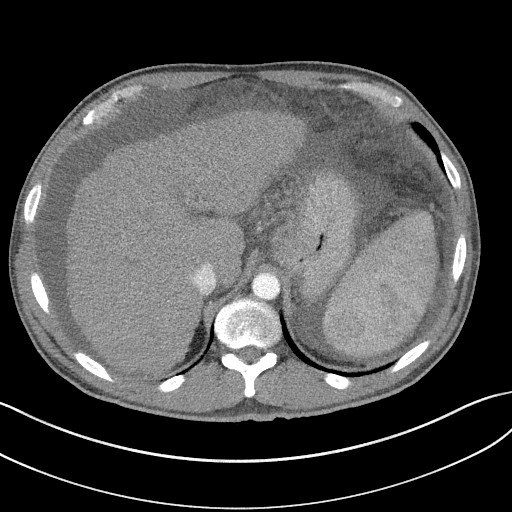
[im 83/116  lung]
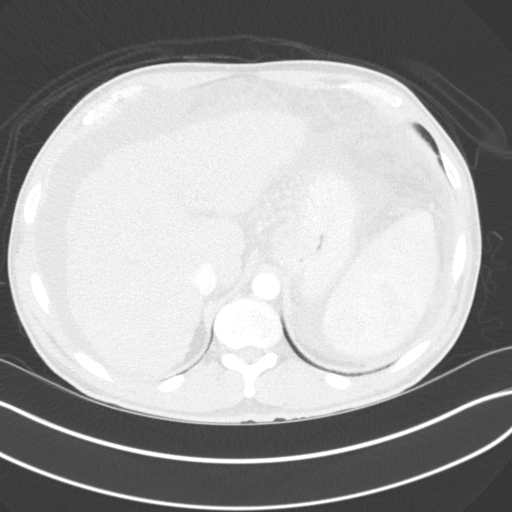
[im 99/116  soft-tissue]
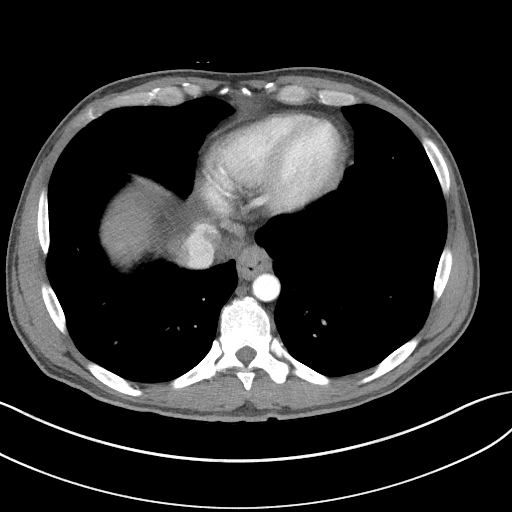
[im 99/116  lung]
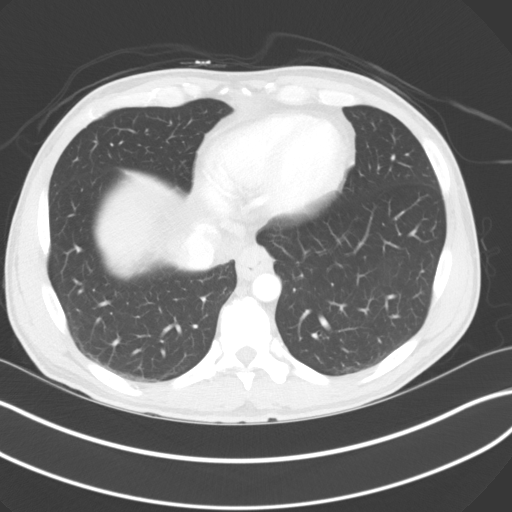

[Series 11: axial nephrographic · axial · 0.73mm/px · z∈[-718,-568]mm · 4 of 117 slices shown]
[im 17/117  soft-tissue]
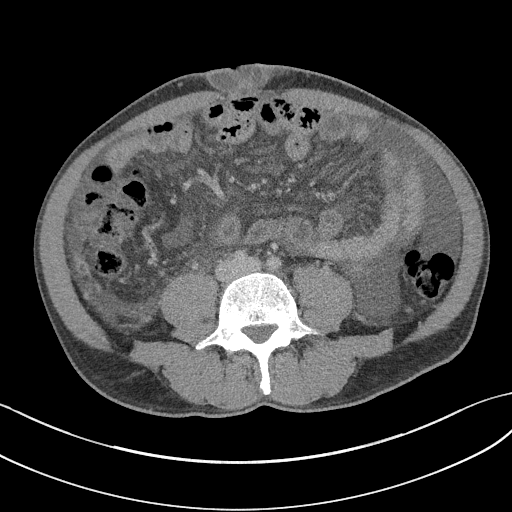
[im 34/117  soft-tissue]
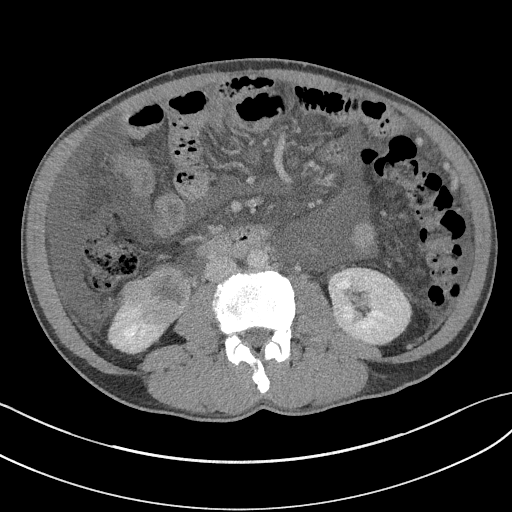
[im 50/117  soft-tissue]
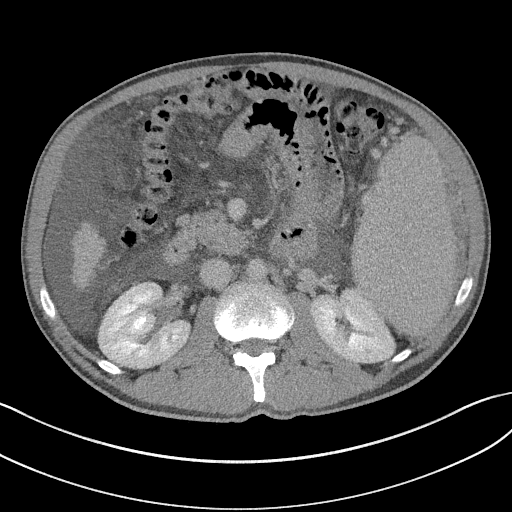
[im 67/117  soft-tissue]
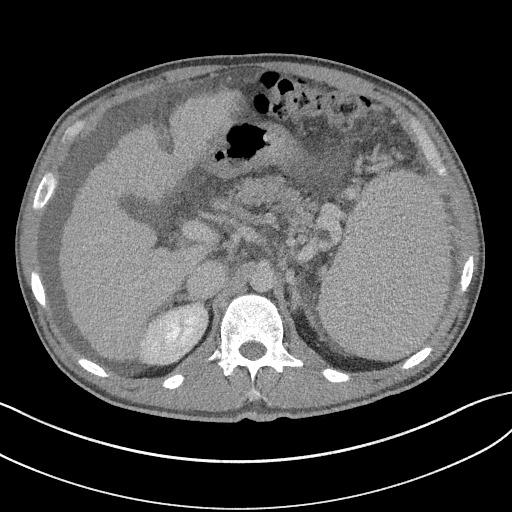

[12 of 46 positions shown; findings below may reference images not displayed]

RADIATION DOSE REDUCTION: This exam was performed according to the
departmental dose-optimization program which includes automated
exposure control, adjustment of the mA and/or kV according to
patient size and/or use of iterative reconstruction technique.

CONTRAST:  100mL OMNIPAQUE IOHEXOL 300 MG/ML  SOLN
FINDINGS: Lower chest: No acute abnormality.

Hepatobiliary: Cirrhotic hepatic morphology. No arterially enhancing
hepatic lesion. Cholelithiasis in a distended gallbladder. No
biliary ductal dilation.

Pancreas: No pancreatic ductal dilation or evidence of acute
inflammation.

Spleen: Splenomegaly measuring 17.7 cm in maximum craniocaudal
dimension.

Adrenals/Urinary Tract: Bilateral adrenal glands are within normal
limits.

No hydronephrosis.

Enhancing heterogeneous 5.4 x 4.3 x 4.7 cm right lower pole renal
lesion extends and effaces both Gerota's fascia and the right psoas
muscle however there appears to be preservation of fat planes
between both structures likely reflecting abutment without invasion.

Left kidney is unremarkable without hydronephrosis or solid
enhancing renal mass.

Stomach/Bowel: No enteric contrast was administered. Small hiatal
hernia otherwise stomach is unremarkable for degree of distension.
No pathologic dilation of small or large bowel in the abdomen.

Vascular/Lymphatic: Normal caliber abdominal aorta. The portal,
splenic and superior mesenteric veins appear patent. Extensive
portosystemic collateral vessels. Right renal vein is patent without
evidence of tumor in vein.

Mildly enlarged/prominent periportal lymph nodes measuring up to 11
mm in short axis on image 54/6. No pathologically enlarged
periaortic lymph nodes.

Other: Large volume abdominal ascites. Small fluid containing
ventral hernia.

Musculoskeletal: No aggressive lytic or blastic lesion of bone.
IMPRESSION: 1. Enhancing heterogeneous 5.4 cm right lower pole renal lesion,
consistent with renal cell carcinoma.
2. No definite evidence of tumor in vein or metastatic disease in
the abdomen.
3. Cirrhosis with sequelae of portal hypertension including
splenomegaly and large volume abdominal ascites.
4. Mildly enlarged/prominent periportal lymph nodes, likely reactive
in the setting of cirrhosis. Attention on follow-up imaging
suggested.
5. Cholelithiasis without evidence of acute cholecystitis.

* onc *

## 2023-06-13 DIAGNOSIS — Z419 Encounter for procedure for purposes other than remedying health state, unspecified: Secondary | ICD-10-CM | POA: Diagnosis not present

## 2023-06-24 DIAGNOSIS — E43 Unspecified severe protein-calorie malnutrition: Secondary | ICD-10-CM | POA: Diagnosis not present

## 2023-06-24 DIAGNOSIS — K7031 Alcoholic cirrhosis of liver with ascites: Secondary | ICD-10-CM | POA: Diagnosis not present

## 2023-06-24 DIAGNOSIS — D508 Other iron deficiency anemias: Secondary | ICD-10-CM | POA: Diagnosis not present

## 2023-06-24 DIAGNOSIS — K7460 Unspecified cirrhosis of liver: Secondary | ICD-10-CM | POA: Diagnosis not present

## 2023-06-24 DIAGNOSIS — D649 Anemia, unspecified: Secondary | ICD-10-CM | POA: Diagnosis not present

## 2023-06-24 DIAGNOSIS — I8510 Secondary esophageal varices without bleeding: Secondary | ICD-10-CM | POA: Diagnosis not present

## 2023-06-24 DIAGNOSIS — K7290 Hepatic failure, unspecified without coma: Secondary | ICD-10-CM | POA: Diagnosis not present

## 2023-06-24 DIAGNOSIS — K766 Portal hypertension: Secondary | ICD-10-CM | POA: Diagnosis not present

## 2023-07-10 DIAGNOSIS — K766 Portal hypertension: Secondary | ICD-10-CM | POA: Diagnosis not present

## 2023-07-10 DIAGNOSIS — E8809 Other disorders of plasma-protein metabolism, not elsewhere classified: Secondary | ICD-10-CM | POA: Diagnosis not present

## 2023-07-10 DIAGNOSIS — Z85528 Personal history of other malignant neoplasm of kidney: Secondary | ICD-10-CM | POA: Diagnosis not present

## 2023-07-10 DIAGNOSIS — K31811 Angiodysplasia of stomach and duodenum with bleeding: Secondary | ICD-10-CM | POA: Diagnosis not present

## 2023-07-10 DIAGNOSIS — R188 Other ascites: Secondary | ICD-10-CM | POA: Diagnosis not present

## 2023-07-10 DIAGNOSIS — D509 Iron deficiency anemia, unspecified: Secondary | ICD-10-CM | POA: Diagnosis not present

## 2023-07-10 DIAGNOSIS — D649 Anemia, unspecified: Secondary | ICD-10-CM | POA: Diagnosis not present

## 2023-07-10 DIAGNOSIS — K3189 Other diseases of stomach and duodenum: Secondary | ICD-10-CM | POA: Diagnosis not present

## 2023-07-10 DIAGNOSIS — I8510 Secondary esophageal varices without bleeding: Secondary | ICD-10-CM | POA: Diagnosis not present

## 2023-07-10 DIAGNOSIS — K5730 Diverticulosis of large intestine without perforation or abscess without bleeding: Secondary | ICD-10-CM | POA: Diagnosis not present

## 2023-07-10 DIAGNOSIS — K5521 Angiodysplasia of colon with hemorrhage: Secondary | ICD-10-CM | POA: Diagnosis not present

## 2023-07-10 DIAGNOSIS — Z7984 Long term (current) use of oral hypoglycemic drugs: Secondary | ICD-10-CM | POA: Diagnosis not present

## 2023-07-10 DIAGNOSIS — E119 Type 2 diabetes mellitus without complications: Secondary | ICD-10-CM | POA: Diagnosis not present

## 2023-07-10 DIAGNOSIS — K7469 Other cirrhosis of liver: Secondary | ICD-10-CM | POA: Diagnosis not present

## 2023-07-10 DIAGNOSIS — M069 Rheumatoid arthritis, unspecified: Secondary | ICD-10-CM | POA: Diagnosis not present

## 2023-07-10 DIAGNOSIS — K635 Polyp of colon: Secondary | ICD-10-CM | POA: Diagnosis not present

## 2023-07-11 DIAGNOSIS — D649 Anemia, unspecified: Secondary | ICD-10-CM | POA: Diagnosis not present

## 2023-07-11 DIAGNOSIS — R188 Other ascites: Secondary | ICD-10-CM | POA: Diagnosis not present

## 2023-07-11 DIAGNOSIS — E119 Type 2 diabetes mellitus without complications: Secondary | ICD-10-CM | POA: Diagnosis not present

## 2023-07-11 DIAGNOSIS — K7469 Other cirrhosis of liver: Secondary | ICD-10-CM | POA: Diagnosis not present

## 2023-07-11 DIAGNOSIS — D509 Iron deficiency anemia, unspecified: Secondary | ICD-10-CM | POA: Diagnosis not present

## 2023-07-11 DIAGNOSIS — K7031 Alcoholic cirrhosis of liver with ascites: Secondary | ICD-10-CM | POA: Diagnosis not present

## 2023-07-12 DIAGNOSIS — D649 Anemia, unspecified: Secondary | ICD-10-CM | POA: Diagnosis not present

## 2023-07-12 DIAGNOSIS — K5520 Angiodysplasia of colon without hemorrhage: Secondary | ICD-10-CM | POA: Diagnosis not present

## 2023-07-12 DIAGNOSIS — I8510 Secondary esophageal varices without bleeding: Secondary | ICD-10-CM | POA: Diagnosis not present

## 2023-07-12 DIAGNOSIS — R188 Other ascites: Secondary | ICD-10-CM | POA: Diagnosis not present

## 2023-07-12 DIAGNOSIS — K5730 Diverticulosis of large intestine without perforation or abscess without bleeding: Secondary | ICD-10-CM | POA: Diagnosis not present

## 2023-07-12 DIAGNOSIS — K3189 Other diseases of stomach and duodenum: Secondary | ICD-10-CM | POA: Diagnosis not present

## 2023-07-12 DIAGNOSIS — D509 Iron deficiency anemia, unspecified: Secondary | ICD-10-CM | POA: Diagnosis not present

## 2023-07-12 DIAGNOSIS — I8500 Esophageal varices without bleeding: Secondary | ICD-10-CM | POA: Diagnosis not present

## 2023-07-12 DIAGNOSIS — K648 Other hemorrhoids: Secondary | ICD-10-CM | POA: Diagnosis not present

## 2023-07-12 DIAGNOSIS — K635 Polyp of colon: Secondary | ICD-10-CM | POA: Diagnosis not present

## 2023-07-12 DIAGNOSIS — D123 Benign neoplasm of transverse colon: Secondary | ICD-10-CM | POA: Diagnosis not present

## 2023-07-12 DIAGNOSIS — K766 Portal hypertension: Secondary | ICD-10-CM | POA: Diagnosis not present

## 2023-07-12 DIAGNOSIS — Z1211 Encounter for screening for malignant neoplasm of colon: Secondary | ICD-10-CM | POA: Diagnosis not present

## 2023-07-12 DIAGNOSIS — K7469 Other cirrhosis of liver: Secondary | ICD-10-CM | POA: Diagnosis not present

## 2023-07-12 DIAGNOSIS — K2930 Chronic superficial gastritis without bleeding: Secondary | ICD-10-CM | POA: Diagnosis not present

## 2023-07-12 DIAGNOSIS — C641 Malignant neoplasm of right kidney, except renal pelvis: Secondary | ICD-10-CM | POA: Diagnosis not present

## 2023-07-12 DIAGNOSIS — K31819 Angiodysplasia of stomach and duodenum without bleeding: Secondary | ICD-10-CM | POA: Diagnosis not present

## 2023-07-13 DIAGNOSIS — K7469 Other cirrhosis of liver: Secondary | ICD-10-CM | POA: Diagnosis not present

## 2023-07-13 DIAGNOSIS — E119 Type 2 diabetes mellitus without complications: Secondary | ICD-10-CM | POA: Diagnosis not present

## 2023-07-13 DIAGNOSIS — I8500 Esophageal varices without bleeding: Secondary | ICD-10-CM | POA: Diagnosis not present

## 2023-07-13 DIAGNOSIS — D649 Anemia, unspecified: Secondary | ICD-10-CM | POA: Diagnosis not present

## 2023-07-13 DIAGNOSIS — Z419 Encounter for procedure for purposes other than remedying health state, unspecified: Secondary | ICD-10-CM | POA: Diagnosis not present

## 2023-07-15 NOTE — Care Plan (Signed)
 Transition of Care Encounter Data   Call attempt: 1 Admission date: 07/11/23 Discharge date: 07/13/23 Discharge diagnosis: Iron deficiency anemia Do you have a hospital follow up appointment?: Yes - Within 14 Days, Yes with Primary PCP clinic F/U Date: 07/18/23 F/U Provider: Gareth Mliss FALCON, FNP Patient post discharge: Medications:      SABRA   [facility]: [phone]:  .  [facility]: [phone]:  .  Other: Contact PCP:      [facility] TRANSITIONAL CASE MANAGEMENT SUMMARY NOTE   Attempted to contact patient today at Cell to complete Transitional Case Management call from [facility]. Left message for patient to return call; direct phone number included in message left for patient; 1st attempt.            Delon CHRISTELLA Rea, RN

## 2023-07-18 ENCOUNTER — Inpatient Hospital Stay: Admitting: Nurse Practitioner

## 2023-08-13 DIAGNOSIS — Z419 Encounter for procedure for purposes other than remedying health state, unspecified: Secondary | ICD-10-CM | POA: Diagnosis not present

## 2023-09-13 DIAGNOSIS — Z419 Encounter for procedure for purposes other than remedying health state, unspecified: Secondary | ICD-10-CM | POA: Diagnosis not present

## 2023-10-13 DIAGNOSIS — Z419 Encounter for procedure for purposes other than remedying health state, unspecified: Secondary | ICD-10-CM | POA: Diagnosis not present

## 2023-12-04 DIAGNOSIS — K4090 Unilateral inguinal hernia, without obstruction or gangrene, not specified as recurrent: Secondary | ICD-10-CM | POA: Diagnosis not present

## 2023-12-12 ENCOUNTER — Emergency Department
Admission: EM | Admit: 2023-12-12 | Discharge: 2023-12-13 | Disposition: A | Discharge status: Other Acute Inpatient Hospital | Attending: Emergency Medicine | Admitting: Emergency Medicine

## 2023-12-12 ENCOUNTER — Other Ambulatory Visit: Payer: Self-pay

## 2023-12-12 DIAGNOSIS — R45850 Homicidal ideations: Secondary | ICD-10-CM | POA: Insufficient documentation

## 2023-12-12 DIAGNOSIS — R443 Hallucinations, unspecified: Secondary | ICD-10-CM

## 2023-12-12 DIAGNOSIS — F1410 Cocaine abuse, uncomplicated: Secondary | ICD-10-CM | POA: Insufficient documentation

## 2023-12-12 DIAGNOSIS — R440 Auditory hallucinations: Secondary | ICD-10-CM | POA: Insufficient documentation

## 2023-12-12 DIAGNOSIS — F15150 Other stimulant abuse with stimulant-induced psychotic disorder with delusions: Secondary | ICD-10-CM | POA: Insufficient documentation

## 2023-12-12 DIAGNOSIS — R441 Visual hallucinations: Secondary | ICD-10-CM | POA: Insufficient documentation

## 2023-12-12 DIAGNOSIS — F29 Unspecified psychosis not due to a substance or known physiological condition: Secondary | ICD-10-CM | POA: Insufficient documentation

## 2023-12-12 DIAGNOSIS — E119 Type 2 diabetes mellitus without complications: Secondary | ICD-10-CM | POA: Insufficient documentation

## 2023-12-12 LAB — CBC
HCT: 30.1 % — ABNORMAL LOW (ref 39.0–52.0)
Hemoglobin: 8.7 g/dL — ABNORMAL LOW (ref 13.0–17.0)
MCH: 20.7 pg — ABNORMAL LOW (ref 26.0–34.0)
MCHC: 28.9 g/dL — ABNORMAL LOW (ref 30.0–36.0)
MCV: 71.7 fL — ABNORMAL LOW (ref 80.0–100.0)
Platelets: 141 K/uL — ABNORMAL LOW (ref 150–400)
RBC: 4.2 MIL/uL — ABNORMAL LOW (ref 4.22–5.81)
RDW: 16.9 % — ABNORMAL HIGH (ref 11.5–15.5)
WBC: 3.5 K/uL — ABNORMAL LOW (ref 4.0–10.5)
nRBC: 0 % (ref 0.0–0.2)

## 2023-12-12 LAB — ACETAMINOPHEN LEVEL: Acetaminophen (Tylenol), Serum: <10 ug/mL — ABNORMAL LOW (ref 10–30)

## 2023-12-12 LAB — ETHANOL: Alcohol, Ethyl (B): <15 mg/dL (ref <15)

## 2023-12-12 LAB — COMPREHENSIVE METABOLIC PANEL WITH GFR
ALT: 11 U/L (ref 0–44)
AST: 20 U/L (ref 15–41)
Albumin: 3.9 g/dL (ref 3.5–5.0)
Alkaline Phosphatase: 117 U/L (ref 38–126)
Anion gap: 12 (ref 5–15)
BUN: 17 mg/dL (ref 6–20)
CO2: 21 mmol/L — ABNORMAL LOW (ref 22–32)
Calcium: 8.9 mg/dL (ref 8.9–10.3)
Chloride: 102 mmol/L (ref 98–111)
Creatinine, Ser: 1.21 mg/dL (ref 0.61–1.24)
GFR, Estimated: >60 mL/min (ref >60)
Glucose, Bld: 233 mg/dL — ABNORMAL HIGH (ref 70–99)
Potassium: 3.4 mmol/L — ABNORMAL LOW (ref 3.5–5.1)
Sodium: 135 mmol/L (ref 135–145)
Total Bilirubin: 1.3 mg/dL — ABNORMAL HIGH (ref 0.0–1.2)
Total Protein: 8.7 g/dL — ABNORMAL HIGH (ref 6.5–8.1)

## 2023-12-12 LAB — SALICYLATE LEVEL: Salicylate Lvl: <7 mg/dL — ABNORMAL LOW (ref 7.0–30.0)

## 2023-12-12 NOTE — ED Notes (Signed)
 Provided pt with cup of water and diet cola at this time per pt request and RN approval.

## 2023-12-12 NOTE — ED Notes (Addendum)
 Patent examiner psychologist, educational) at bedside

## 2023-12-12 NOTE — ED Provider Notes (Signed)
 [facility] Provider Note    Event Date/Time   First MD Initiated Contact with Patient 12/12/23 2207     (approximate)   History   Chief Complaint Hallucinations   HPI  Cole Pierce is a 58 y.o. male with past medical history of diabetes, cirrhosis, rheumatoid arthritis, and iron deficiency anemia who presents to the ED complaining of hallucinations.  Patient reports that he has been seeing his probation officer and multiple other officers hiding outside of his home.  He is also frequently hearing their voice outside of his home.  He is concerned they are tracking him and so he cut off his ankle bracelet earlier today.  Afterwards, probation officer contacted patient's daughter, who found him hiding in a closet in his home.  Patient reports that he is having thoughts of harming his probation officer, denies any suicidal ideation.  He does admit crack cocaine use over the past 2 days, denies alcohol consumption.  Per daughter, patient has not been taking any of his medications.     Physical Exam   Triage Vital Signs: ED Triage Vitals  Encounter Vitals Group     BP 12/12/23 2159 (!) 169/93     Girls Systolic BP Percentile --      Girls Diastolic BP Percentile --      Boys Systolic BP Percentile --      Boys Diastolic BP Percentile --      Pulse Rate 12/12/23 2159 100     Resp 12/12/23 2159 18     Temp 12/12/23 2159 98.4 F (36.9 C)     Temp src --      SpO2 12/12/23 2159 100 %     Weight 12/12/23 2155 162 lb (73.5 kg)     Height 12/12/23 2155 5' 8 (1.727 m)     Head Circumference --      Peak Flow --      Pain Score 12/12/23 2155 10     Pain Loc --      Pain Education --      Exclude from Growth Chart --     Most recent vital signs: Vitals:   12/12/23 2159  BP: (!) 169/93  Pulse: 100  Resp: 18  Temp: 98.4 F (36.9 C)  SpO2: 100%    Constitutional: Alert and oriented. Eyes: Conjunctivae are normal. Head: Atraumatic. Nose: No  congestion/rhinnorhea. Mouth/Throat: Mucous membranes are moist.  Cardiovascular: Normal rate, regular rhythm. Grossly normal heart sounds.  2+ radial pulses bilaterally. Respiratory: Normal respiratory effort.  No retractions. Lungs CTAB. Gastrointestinal: Soft and nontender. No distention. Musculoskeletal: No lower extremity tenderness nor edema.  Neurologic:  Normal speech and language. No gross focal neurologic deficits are appreciated.    ED Results / Procedures / Treatments   Labs (all labs ordered are listed, but only abnormal results are displayed) Labs Reviewed  COMPREHENSIVE METABOLIC PANEL WITH GFR - Abnormal; Notable for the following components:      Result Value   Potassium 3.4 (*)    CO2 21 (*)    Glucose, Bld 233 (*)    Total Protein 8.7 (*)    Total Bilirubin 1.3 (*)    All other components within normal limits  CBC - Abnormal; Notable for the following components:   WBC 3.5 (*)    RBC 4.20 (*)    Hemoglobin 8.7 (*)    HCT 30.1 (*)    MCV 71.7 (*)    MCH 20.7 (*)  MCHC 28.9 (*)    RDW 16.9 (*)    Platelets 141 (*)    All other components within normal limits  ETHANOL  URINE DRUG SCREEN  SALICYLATE LEVEL  ACETAMINOPHEN  LEVEL  IRON AND TIBC  FERRITIN    PROCEDURES:  Critical Care performed: No  Procedures   MEDICATIONS ORDERED IN ED: Medications - No data to display   IMPRESSION / MDM / ASSESSMENT AND PLAN / ED COURSE  I reviewed the triage vital signs and the nursing notes.                              58 y.o. male with past medical history of diabetes, cirrhosis, rheumatoid arthritis, and iron deficiency anemia who presents to the ED complaining of auditory and visual hallucinations with thoughts of harming his probation officer.  Patient's presentation is most consistent with acute presentation with potential threat to life or bodily function.  Differential diagnosis includes, but is not limited to, psychosis, substance abuse,  medication noncompliance, mania, anxiety, depression.  Patient nontoxic-appearing and in no acute distress, vital signs are unremarkable.  Labs show anemia that is similar compared to previous, patient reports long history of iron deficiency anemia and denies any recent bleeding.  We will add on iron and ferritin levels.  No significant electrolyte abnormality or AKI noted, LFTs are unremarkable.  No asterixis or other features concerning for hepatic encephalopathy.  Patient may be medically cleared for psychiatric disposition, was placed under IVC due to psychosis with homicidal ideation.  Psychiatric evaluation is pending at this time.  The patient has been placed in psychiatric observation due to the need to provide a safe environment for the patient while obtaining psychiatric consultation and evaluation, as well as ongoing medical and medication management to treat the patient's condition.  The patient has been placed under full IVC at this time.      FINAL CLINICAL IMPRESSION(S) / ED DIAGNOSES   Final diagnoses:  Hallucinations  Homicidal ideation     Rx / DC Orders   ED Discharge Orders     None        Note:  This document was prepared using Dragon voice recognition software and may include unintentional dictation errors.   [name], MD 12/12/23 [phone]

## 2023-12-12 NOTE — ED Notes (Addendum)
 Pt belongings:  Blue shirt Jeans [employer] soup Green jacket [employer] Grey socks Brown belt [employer]  PT belongings sent home with pts daughter

## 2023-12-12 NOTE — ED Notes (Addendum)
 Daughter Duwaine (number in chart) updated on plan of care Permission from pt to notify her and ask her any questions if necessary.

## 2023-12-12 NOTE — ED Triage Notes (Addendum)
 Pt reports he has been having visual hallucinations for the past year, pt has not been seen for same in the past. Not on any medications. Pt denies SI. Pt has been out of prison and on probation. Pt cut his ankle monitor off tonight and his hallucinations are that the police are after him and going to kill him. Per daughter tonight pt was hallucinating that they were outside of his house and telling him they were going to shoot him so pt cut his monitor off and was hiding in the closet. Pt denies alcohol use pt has hx cirrhosis pt smokes crack. Pt also reports he has bugs coming from orifices.

## 2023-12-12 NOTE — ED Notes (Signed)
ivc by MD Jessup/psych consult ordered/pending.

## 2023-12-13 DIAGNOSIS — C649 Malignant neoplasm of unspecified kidney, except renal pelvis: Secondary | ICD-10-CM | POA: Diagnosis not present

## 2023-12-13 DIAGNOSIS — R161 Splenomegaly, not elsewhere classified: Secondary | ICD-10-CM | POA: Diagnosis not present

## 2023-12-13 DIAGNOSIS — K7460 Unspecified cirrhosis of liver: Secondary | ICD-10-CM | POA: Diagnosis not present

## 2023-12-13 DIAGNOSIS — F16151 Hallucinogen abuse with hallucinogen-induced psychotic disorder with hallucinations: Secondary | ICD-10-CM | POA: Diagnosis not present

## 2023-12-13 DIAGNOSIS — F1424 Cocaine dependence with cocaine-induced mood disorder: Secondary | ICD-10-CM | POA: Diagnosis not present

## 2023-12-13 DIAGNOSIS — F15951 Other stimulant use, unspecified with stimulant-induced psychotic disorder with hallucinations: Secondary | ICD-10-CM

## 2023-12-13 DIAGNOSIS — F15150 Other stimulant abuse with stimulant-induced psychotic disorder with delusions: Secondary | ICD-10-CM | POA: Diagnosis not present

## 2023-12-13 DIAGNOSIS — Z419 Encounter for procedure for purposes other than remedying health state, unspecified: Secondary | ICD-10-CM | POA: Diagnosis not present

## 2023-12-13 DIAGNOSIS — Z7984 Long term (current) use of oral hypoglycemic drugs: Secondary | ICD-10-CM | POA: Diagnosis not present

## 2023-12-13 DIAGNOSIS — K8080 Other cholelithiasis without obstruction: Secondary | ICD-10-CM | POA: Diagnosis not present

## 2023-12-13 DIAGNOSIS — E119 Type 2 diabetes mellitus without complications: Secondary | ICD-10-CM | POA: Diagnosis not present

## 2023-12-13 DIAGNOSIS — F29 Unspecified psychosis not due to a substance or known physiological condition: Secondary | ICD-10-CM | POA: Diagnosis not present

## 2023-12-13 DIAGNOSIS — K469 Unspecified abdominal hernia without obstruction or gangrene: Secondary | ICD-10-CM | POA: Diagnosis not present

## 2023-12-13 DIAGNOSIS — I10 Essential (primary) hypertension: Secondary | ICD-10-CM | POA: Diagnosis not present

## 2023-12-13 DIAGNOSIS — B1920 Unspecified viral hepatitis C without hepatic coma: Secondary | ICD-10-CM | POA: Diagnosis not present

## 2023-12-13 DIAGNOSIS — F15151 Other stimulant abuse with stimulant-induced psychotic disorder with hallucinations: Secondary | ICD-10-CM

## 2023-12-13 DIAGNOSIS — N289 Disorder of kidney and ureter, unspecified: Secondary | ICD-10-CM | POA: Diagnosis not present

## 2023-12-13 DIAGNOSIS — M069 Rheumatoid arthritis, unspecified: Secondary | ICD-10-CM | POA: Diagnosis not present

## 2023-12-13 LAB — FERRITIN: Ferritin: 13 ng/mL — ABNORMAL LOW (ref 24–336)

## 2023-12-13 LAB — URINALYSIS, ROUTINE W REFLEX MICROSCOPIC
Bacteria, UA: NONE SEEN
Bilirubin Urine: NEGATIVE
Glucose, UA: >=500 mg/dL — AB
Hgb urine dipstick: NEGATIVE
Ketones, ur: NEGATIVE mg/dL
Leukocytes,Ua: NEGATIVE
Nitrite: NEGATIVE
Protein, ur: 30 mg/dL — AB
RBC / HPF: 0 RBC/hpf (ref 0–5)
Specific Gravity, Urine: 1.027 (ref 1.005–1.030)
Squamous Epithelial / HPF: 0 /HPF (ref 0–5)
WBC, UA: 0 WBC/hpf (ref 0–5)
pH: 5 (ref 5.0–8.0)

## 2023-12-13 LAB — RESP PANEL BY RT-PCR (RSV, FLU A&B, COVID)  RVPGX2
Influenza A by PCR: NEGATIVE
Influenza B by PCR: NEGATIVE
Resp Syncytial Virus by PCR: NEGATIVE
SARS Coronavirus 2 by RT PCR: NEGATIVE

## 2023-12-13 LAB — AMMONIA: Ammonia: 61 umol/L — ABNORMAL HIGH (ref 9–35)

## 2023-12-13 LAB — URINE DRUG SCREEN
Amphetamines: NEGATIVE
Barbiturates: NEGATIVE
Benzodiazepines: NEGATIVE
Cocaine: POSITIVE — AB
Fentanyl: NEGATIVE
Methadone Scn, Ur: NEGATIVE
Opiates: NEGATIVE
Tetrahydrocannabinol: NEGATIVE

## 2023-12-13 LAB — IRON AND TIBC
Iron: 14 ug/dL — ABNORMAL LOW (ref 45–182)
Saturation Ratios: 3 % — ABNORMAL LOW (ref 17.9–39.5)
TIBC: 540 ug/dL — ABNORMAL HIGH (ref 250–450)
UIBC: 526 ug/dL

## 2023-12-13 MED ORDER — LACTULOSE 10 GM/15ML PO SOLN
10.0000 g | Freq: Three times a day (TID) | ORAL | Status: DC
  Administered 2023-12-13 (×2): 10 g via ORAL
  Filled 2023-12-13 (×2): qty 30

## 2023-12-13 MED ORDER — RISPERIDONE 0.25 MG PO TABS: 0.5000 mg | ORAL_TABLET | Freq: Every day | ORAL | Status: DC

## 2023-12-13 MED ORDER — RISPERIDONE 0.25 MG PO TABS
0.5000 mg | ORAL_TABLET | Freq: Once | ORAL | Status: AC
  Administered 2023-12-13: 0.5 mg via ORAL
  Filled 2023-12-13: qty 2

## 2023-12-13 MED ORDER — ACETAMINOPHEN 500 MG PO TABS
1000.0000 mg | ORAL_TABLET | Freq: Four times a day (QID) | ORAL | Status: DC | PRN
  Administered 2023-12-13 (×2): 1000 mg via ORAL
  Filled 2023-12-13 (×2): qty 2

## 2023-12-13 NOTE — Consult Note (Signed)
 [facility] Consult Note  Patient Name: Cole Pierce MRN: [id number] DOB: 1965/12/06 DATE OF Consult: 12/13/2023  PRIMARY PSYCHIATRIC DIAGNOSES  Stimulant-induced psychotic disorder with delusions, with hallucinations  RECOMMENDATIONS  Formulation: 58 year old man with a history of cirrhosis, stroke, RA, and recent kidney cancer presents with acute psychosis characterized by paranoid delusions and auditory hallucinations, resulting in impaired judgment and safety risk. Symptoms are likely cocaine-induced; chart review shows no evidence of hepatic decompensation (total bilirubin 1.3?mg/dL, normal albumin , mildly decreased platelets). Due to severity of psychosis, safety concerns, and need for close monitoring and medication initiation, inpatient psychiatric admission is indicated.  Admit to inpatient psych for safety and Stabilization.   Medication recommendations:  Start Risperidone 0.5mg  PO at bedtime for psychosis as this is a good first-line in mild liver impairment. Titration: Increase by 0.25-0.5 mg every 3-5 days as tolerated. Max usual dose in mild liver disease: 2-3 mg/day Monitoring: Sedation Extrapyramidal symptoms  Blood pressure (orthostasis) Please stop all antipsychotic and QTc prolonging medications if patient's QTc is greater than 500 ms.   Non-Medication/therapeutic recommendations: Maintain safety precautions.  Refer to outpatient psychiatric provider for medication management, therapy, and substance abuse treatment upon discharge from inpatient psych admission.   Communication: Treatment team members (and family members if applicable) who were involved in treatment/care discussions and planning, and with whom we spoke or engaged with via secure text/chat, include the following: patient's treatment team. Thank you for involving us  in the care of this patient. If you have any additional questions or concerns, please call [phone] and ask for me or the provider  on-call.  TELEPSYCHIATRY ATTESTATION & CONSENT  As the provider for this telehealth consult, I attest that I verified the patients identity using two separate identifiers, introduced myself to the patient, provided my credentials, disclosed my location, and performed this encounter via a HIPAA-compliant, real-time, face-to-face, two-way, interactive audio and video platform and with the full consent and agreement of the patient (or guardian as applicable.)  Patient physical location: [place]. Telehealth provider physical location: home office in state of [place].  Video start time: 0436 (Central Time) Video end time: 0459 (Central Time)  IDENTIFYING DATA  Clif Serio is a 58 y.o. year-old male for whom a psychiatric consultation has been ordered by the primary provider. The patient was identified using two separate identifiers.  CHIEF COMPLAINT/REASON FOR CONSULT  Because nobody believe me. They think I'm crazy. They think I see things that ain't there. They're they're there to me.  HISTORY OF PRESENT ILLNESS (HPI)  TA 58 year old man was evaluated in the hospital setting following concerns related to his mental state and safety. It was noted that he has a history of prolonged incarceration, having spent approximately twenty years in prison, with the most recent release four years ago. He is currently on probation and expressed significant distress regarding perceived harassment and surveillance by probation officers and a task force. He described persistent beliefs that these individuals are stalking him, following him to his home, disguising themselves, installing cameras, and communicating with him through the television. He reported hearing voices and conversations, including threats to his life, which have resulted in marked fear and anxiety. These experiences have led to behaviors such as cutting off his ankle bracelet in an attempt to flee perceived danger.  He recounted that these symptoms  began after his release from prison, with a gradual worsening over the past two years. He described episodes of seeing individuals dressed in tactical gear and expressed certainty about recognizing some  of them by voice and appearance. He also reported that these experiences have been ongoing, with frequent episodes of surveillance and auditory hallucinations, particularly at night. There is a clear conviction in the reality of these experiences, despite acknowledgment that others do not believe him.  Medical history is notable for cirrhosis of the liver, rheumatoid arthritis, history of stroke, gallstones, and recent kidney cancer. He reported significant physical limitations due to arthritis and neuropathy, including difficulty with ambulation and fine motor tasks. Substance use history includes cocaine use, with the most recent use being a gram or two on the day of evaluation. He denied alcohol and marijuana use, citing probation restrictions and liver disease. He denied current or past suicidal ideation and stated he is not experiencing thoughts of self-harm. He also denied intent to harm others except in self-defense if threatened. There is no prior history of psychiatric hospitalization or formal mental health treatment.  The patient expressed ongoing fear for his safety and described sleep disruption related to his symptoms. He reported ongoing anxiety and hypervigilance. The impact of his medical comorbidities and substance use on his mental state was discussed. Chart review  show no evidence of hepatic decompensation or active encephalopathy. Labs show total bilirubin 1.3?mg/dL, normal albumin , and only mildly decreased platelets; psychosis are more likely cocaine-related. It was recommended that he receive medication to address his psychotic symptoms and that his response be closely monitored during hospitalization.  PAST PSYCHIATRIC HISTORY  - No past psychiatric diagnoses, suicide attempts, or  self-harm behaviors were reported. - No prior psychiatric treatments, hospitalizations, or therapy were reported. - No previous psychiatric medications were mentioned as having been tried. Otherwise as per HPI above.  PAST MEDICAL HISTORY  Past Medical History:  Diagnosis Date   Arthritis    Ascites    Cancer (HCC)    Cirrhosis (HCC)    Diabetes mellitus without complication (HCC)    Hernia, hiatal    IDA (iron deficiency anemia)    Inguinal hernia    right   Portal venous hypertension (HCC)    Renal cell carcinoma (HCC)    Splenomegaly    Thrombocytopenia      HOME MEDICATIONS  Facility Ordered Medications  Medication   acetaminophen  (TYLENOL ) tablet 1,000 mg   PTA Medications  Medication Sig   Blood Glucose Monitoring Suppl w/Device KIT 1 each by Does not apply route daily.   Glucose Blood (BLOOD GLUCOSE TEST STRIPS) STRP Use as directed to monitor FSBS once daily for DM   acetaminophen  (TYLENOL ) 325 MG tablet Take 2 tablets (650 mg total) by mouth every 6 (six) hours as needed for mild pain, moderate pain, fever or headache (or Fever >/= 101).   cyclobenzaprine  (FLEXERIL ) 5 MG tablet Take 1 tablet (5 mg total) by mouth 2 (two) times daily as needed for muscle spasms.   lactulose  (CEPHULAC ) 10 g packet Take 1 packet (10 g total) by mouth 3 (three) times daily as needed (titrate to 1-2 BM per day).   lactulose  (CEPHULAC ) 10 g packet Take 10 g by mouth 3 (three) times daily.   oxyCODONE  (OXY IR/ROXICODONE ) 5 MG immediate release tablet Take 5 mg by mouth every 4 (four) hours as needed for severe pain.     ALLERGIES  Allergies[1]  SOCIAL & SUBSTANCE USE HISTORY  Social History   Socioeconomic History   Marital status: Single    Spouse name: Not on file   Number of children: 3   Years of education: Not on file  Highest education level: Not on file  Occupational History   Not on file  Tobacco Use   Smoking status: Some Days    Types: Cigars   Smokeless tobacco:  Never  Vaping Use   Vaping status: Never Used  Substance and Sexual Activity   Alcohol use: Not Currently    Comment: had quit for 13 years and recently started back   Drug use: Never   Sexual activity: Yes    Birth control/protection: None  Other Topics Concern   Not on file  Social History Narrative   ** Merged History Encounter **       Social Drivers of Health   Tobacco Use: High Risk (12/12/2023)   Patient History    Smoking Tobacco Use: Some Days    Smokeless Tobacco Use: Never    Passive Exposure: Not on file  Financial Resource Strain: Low Risk (07/12/2023)   Received [facility]   Overall Financial Resource Strain (CARDIA)    How hard is it for you to pay for the very basics like food, housing, medical care, and heating?: Not hard at all  Food Insecurity: No Food Insecurity (07/12/2023)   Received from [facility]   Epic    Within the past 12 months, you worried that your food would run out before you got the money to buy more.: Never true    Within the past 12 months, the food you bought just didn't last and you didn't have money to get more.: Never true  Transportation Needs: No Transportation Needs (07/12/2023)   Received from [facility]   PRAPARE - Transportation    Lack of Transportation (Medical): No    Lack of Transportation (Non-Medical): No  Physical Activity: Not on file  Stress: Not on file  Social Connections: Not on file  Depression (PHQ2-9): Low Risk (08/07/2022)   Depression (PHQ2-9)    PHQ-2 Score: 0  Alcohol Screen: Low Risk (04/03/2021)   Alcohol Screen    Last Alcohol Screening Score (AUDIT): 0  Housing: Low Risk (08/07/2022)   Housing    Last Housing Risk Score: 0  Utilities: Low Risk (08/28/2022)   Received from [facility]   Utilities    Within the past 12 months, have you been unable to get utilities(heat, electricity) when it was really needed?: No  Recent Concern: Utilities - At Risk (08/07/2022)   [facility] Utilities     Threatened with loss of utilities: Yes  Health Literacy: Not on file   Tobacco Use History[2] Social History   Substance and Sexual Activity  Alcohol Use Not Currently   Comment: had quit for 13 years and recently started back   Social History   Substance and Sexual Activity  Drug Use Never      FAMILY HISTORY  History reviewed. No pertinent family history. Family Psychiatric History (if known):  none reported  MENTAL STATUS EXAM (MSE)  Mental Status Exam: General Appearance: wearing hospital scrubs  Orientation:  Full (Time, Place, and Person)  Memory:   intact   Concentration:  fair  Recall:  fair  Attention  Fair  Eye Contact:  Fair  Speech:  Clear and Coherent  Language:  Good  Volume:  Normal  Mood: I'm scared for my life.  Affect:  appears anxious and fearful, particularly when discussing perceived threats. Affect is congruent with the content being discussed.  Thought Process:   Disorganized at times but generally linear and goal directed when responding to direct  questions.  Thought Content:  Marked paranoid and persecutory delusions, including beliefs that probation officers and a task force are stalking him, drilling holes in his trailer, placing cameras, and speaking to him through the TV. He described hearing voices and seeing people in masks, stating, They come to my house to sit, dress up with masks and suits and stuff, sat in bushes and trees... They talk to me through the TV. He expressed fear that these individuals intend to harm him, saying, They said, well, we're gonna go in there and kill him tonight. There is clear evidence of auditory and visual hallucinations, paranoia, and delusional thoughts.  Suicidal Thoughts:  No  Homicidal Thoughts:  No  Judgement:  impaired by ongoing psychosis and substance use.  Insight:  Limited insight into the nature of his psychiatric symptoms, attributing them to external causes  Psychomotor Activity:  Normal   Akathisia:  Negative  Fund of Knowledge:  Fair    Assets:  Manufacturing Systems Engineer Desire for Improvement Housing Social Support  Cognition:  WNL  ADL's:  Intact  AIMS (if indicated):       VITALS  Blood pressure (!) 169/93, pulse 100, temperature 98.4 F (36.9 C), resp. rate 18, height 5' 8 (1.727 m), weight 73.5 kg, SpO2 100%.  LABS  Admission on 12/12/2023  Component Date Value Ref Range Status   Sodium 12/12/2023 135  135 - 145 mmol/L Final   Potassium 12/12/2023 3.4 (L)  3.5 - 5.1 mmol/L Final   Chloride 12/12/2023 102  98 - 111 mmol/L Final   CO2 12/12/2023 21 (L)  22 - 32 mmol/L Final   Glucose, Bld 12/12/2023 233 (H)  70 - 99 mg/dL Final   Glucose reference range applies only to samples taken after fasting for at least 8 hours.   BUN 12/12/2023 17  6 - 20 mg/dL Final   Creatinine, Ser 12/12/2023 1.21  0.61 - 1.24 mg/dL Final   Calcium 87/88/7974 8.9  8.9 - 10.3 mg/dL Final   Total Protein 87/88/7974 8.7 (H)  6.5 - 8.1 g/dL Final   Albumin  12/12/2023 3.9  3.5 - 5.0 g/dL Final   AST 87/88/7974 20  15 - 41 U/L Final   ALT 12/12/2023 11  0 - 44 U/L Final   Alkaline Phosphatase 12/12/2023 117  38 - 126 U/L Final   Total Bilirubin 12/12/2023 1.3 (H)  0.0 - 1.2 mg/dL Final   GFR, Estimated 12/12/2023 >60  >60 mL/min Final   Comment: (NOTE) Calculated using the CKD-EPI Creatinine Equation (2021)    Anion gap 12/12/2023 12  5 - 15 Final   Performed at [facility], [address] [place], [place] [address]   Alcohol, Ethyl (B) 12/12/2023 <15  <15 mg/dL Final   Comment: (NOTE) For medical purposes only. Performed at [facility], [address] [place], [place] [address]    WBC 12/12/2023 3.5 (L)  4.0 - 10.5 K/uL Final   RBC 12/12/2023 4.20 (L)  4.22 - 5.81 MIL/uL Final   Hemoglobin 12/12/2023 8.7 (L)  13.0 - 17.0 g/dL Final   Comment: Reticulocyte Hemoglobin testing may be clinically indicated, consider ordering this additional test [id number]     HCT 12/12/2023 30.1 (L)  39.0 - 52.0 % Final   MCV 12/12/2023 71.7 (L)  80.0 - 100.0 fL Final   MCH 12/12/2023 20.7 (L)  26.0 - 34.0 pg Final   MCHC 12/12/2023 28.9 (L)  30.0 - 36.0 g/dL Final   RDW 87/88/7974 16.9 (H)  11.5 - 15.5 % Final   Platelets 12/12/2023 141 (L)  150 - 400 K/uL Final   nRBC 12/12/2023 0.0  0.0 - 0.2 % Final   Performed at [facility], [address] [place], [place] [address]   Opiates 12/13/2023 NEGATIVE  NEGATIVE Final   Cocaine 12/13/2023 POSITIVE (A)  NEGATIVE Final   Benzodiazepines 12/13/2023 NEGATIVE  NEGATIVE Final   Amphetamines 12/13/2023 NEGATIVE  NEGATIVE Final   Tetrahydrocannabinol 12/13/2023 NEGATIVE  NEGATIVE Final   Barbiturates 12/13/2023 NEGATIVE  NEGATIVE Final   Methadone Scn, Ur 12/13/2023 NEGATIVE  NEGATIVE Final   Fentanyl  12/13/2023 NEGATIVE  NEGATIVE Final   Comment: (NOTE) Drug screen is for Medical Purposes only. Positive results are preliminary only. If confirmation is needed, notify lab within 5 days.  Drug Class                 Cutoff (ng/mL) Amphetamine and metabolites 1000 Barbiturate and metabolites 200 Benzodiazepine              200 Opiates and metabolites     300 Cocaine and metabolites     300 THC                         50 Fentanyl                     5 Methadone                   300  Trazodone is metabolized in vivo to several metabolites,  including pharmacologically active m-CPP, which is excreted in the  urine.  Immunoassay screens for amphetamines and MDMA have potential  cross-reactivity with these compounds and may provide false positive  result.  Performed at [facility], [address] [place], [place] [address]    Salicylate Lvl 12/12/2023 <7.0 (L)  7.0 - 30.0 mg/dL Final   Performed at [facility], [address] [place], [place] [address]   Acetaminophen  (Tylenol ), Serum 12/12/2023 <10 (L)  10 - 30 ug/mL Final   Comment: (NOTE) Toxic concentrations can be more  effectively related to post dose interval; >200, >100, and >50 ug/mL serum concentrations correspond to toxic concentrations at 4, 8, and 12 hours post dose, respectively.  Performed at [facility], [address] [place], [place] [address]    Iron 12/12/2023 14 (L)  45 - 182 ug/dL Final   TIBC 87/88/7974 540 (H)  250 - 450 ug/dL Final   Saturation Ratios 12/12/2023 3 (L)  17.9 - 39.5 % Final   UIBC 12/12/2023 526  ug/dL Final   Performed at [facility], [address] [place], [place] [address]   Ferritin 12/12/2023 13 (L)  24 - 336 ng/mL Final   Performed at [facility], [address], [place], [place] [address]    PSYCHIATRIC REVIEW OF SYSTEMS (ROS)  - Persistent auditory and visual hallucinations involving beliefs of being watched, followed, and targeted by probation officers and others, including through the TV and outside his home. - No current suicidal ideation or intent was expressed; he stated he would not harm himself. - No current homicidal ideation, though he described being prepared to defend himself if threatened. - Denies use of cannabis or alcohol; admits to recent cocaine use (last use today, smoked a gram or two). - Reports significant anxiety and fear for his safety related to his perceptual disturbances. - No history of prior psychiatric  treatment or hospitalizations reported.  Additional findings:      Musculoskeletal: No abnormal movements observed      Gait & Station: Laying/Sitting      Pain Screening: Denies      Nutrition & Dental Concerns: none reported  RISK FORMULATION/ASSESSMENT  Is the patient experiencing any suicidal or homicidal ideations: No       Explain if yes:  Protective factors considered for safety management: include stated love for his family and access to medical care  Risk factors/concerns considered for safety management:  include male gender, history of incarceration, chronic medical issues  (cirrhosis, rheumatoid arthritis, kidney cancer), ongoing substance use (cocaine), and current psychosis with paranoid and persecutory delusions. Modifiable risk factors include active cocaine use, untreated psychosis, and ongoing stressors related to probation and perceived threats.   Is there a safety management plan with the patient and treatment team to minimize risk factors and promote protective factors: Yes           Explain: admit to psych Is crisis care placement or psychiatric hospitalization recommended: Yes     Based on my current evaluation and risk assessment, patient is determined at this time to be at:  Moderate Risk  *RISK ASSESSMENT Risk assessment is a dynamic process; it is possible that this patient's condition, and risk level, may change. This should be re-evaluated and managed over time as appropriate. Please re-consult psychiatric consult services if additional assistance is needed in terms of risk assessment and management. If your team decides to discharge this patient, please advise the patient how to best access emergency psychiatric services, or to call 911, if their condition worsens or they feel unsafe in any way.   Keidan Aumiller Velna, NP Telepsychiatry Consult Services    [1]  Allergies Allergen Reactions   Zantac [Ranitidine Hcl] Anaphylaxis   Penicillins Hives    Hives in mouth  [2]  Social History Tobacco Use  Smoking Status Some Days   Types: Cigars  Smokeless Tobacco Never

## 2023-12-13 NOTE — ED Notes (Signed)
 Voicemail left for report at [facility].

## 2023-12-13 NOTE — ED Notes (Signed)
 Report called to [place] at [facility]

## 2023-12-13 NOTE — ED Notes (Signed)
 Patient sleeping

## 2023-12-13 NOTE — ED Notes (Signed)
 IVC / psych consult pending /moved to [facility]

## 2023-12-13 NOTE — Progress Notes (Signed)
 Per [facility] ([facility]),  there are no appropriate beds available at this time.  Patient has been referred to the following facilities:  Service Provider Phone  [facility]  [phone]  [facility]  [phone]  [facility]  [phone]  [facility]  [phone]  [facility]  [phone]  [facility]  [phone]  [facility]  [phone]  [facility]  [phone]  [facility]  [phone]  [facility]  [phone]  [facility]  [phone]  [facility]  [phone]  [facility]  [phone]  [facility]  [phone], [place] [phone]

## 2023-12-13 NOTE — ED Notes (Signed)
[place]  [employer]  called  for  transport  to  [facility]

## 2023-12-13 NOTE — ED Notes (Signed)

## 2023-12-13 NOTE — ED Notes (Signed)
 Meal provided

## 2023-12-13 NOTE — ED Notes (Signed)
 Lab called to add on labs to previously drawn blood

## 2023-12-13 NOTE — Progress Notes (Addendum)
 Patient has been accepted to [facility] for 12/13/23. Patient was assigned to [facility]. Accepting physician is Dr. Millie Manners. Call report to [phone] Option 2 (Please leave voicemail). Representative was [employer].   ER Staff is aware of it: Olam, ER Secretary Dr. Waymond, ER MD Delon PEAK, Patient's Nurse   Address:  [address]                  [place], [place] [address]

## 2023-12-13 NOTE — ED Notes (Signed)
 Patient in assigned room sleeping

## 2023-12-13 NOTE — ED Notes (Signed)
 Daughter left for the night Pt calm and cooperative at this time Given turkey sandwich and something to drink

## 2023-12-13 NOTE — BH Assessment (Signed)
 Comprehensive Clinical Assessment (CCA) Note  12/13/2023 Cole Pierce [id number]  Chief Complaint: Patient is a 58 year old male presenting to [facility] ED under IVC. Per triage note Pt reports he has been having visual hallucinations for the past year, pt has not been seen for same in the past. Not on any medications. Pt denies SI. Pt has been out of prison and on probation. Pt cut his ankle monitor off tonight and his hallucinations are that the police are after him and going to kill him. Per daughter tonight pt was hallucinating that they were outside of his house and telling him they were going to shoot him so pt cut his monitor off and was hiding in the closet. Pt denies alcohol use pt has hx cirrhosis pt smokes crack. Pt also reports he has bugs coming from orifices.   During assessment patient appears alert and oriented x2, cooperative, but hyper verbal, his thoughts are disorganized and has to be redirected during the assessment. Patient reports people are trying to kill me, the police think it's a game, it's been after me for 4 years, they tore my trailer up, they put cameras in my house, they got some cool ass toys, they got bugs that look like cameras. If I see a camera I snatch it, they put bugs in my clothes and in my eyes. When asked if the patient is currently experiencing any VH, he denies but reports that they were in the trees before I got in here. Patient reports experiencing VH for 2 years. Patient also reports having AH I hear the voices from the police. Patient reports due to having VH, he wants to harm the police if they try to harm me, I'll harm them. Patient currently uses cocaine once a month, he reports he last used today 12/12/23 via smoking. Patient also has a prison history, he has been released for the past 4 years and is currently on probation. Patient is not on any current mental health medications. Patient denies SI.  Patient's daughter Cole Pierce is presenting  with the patient as she reports when I got there he was in the closet, he had me check his bedroom, I talked with him for a little bit then brought him here. Patient's daughter reports that this is not unusual behavior for the patient.  Chief Complaint  Patient presents with   Hallucinations   Visit Diagnosis: Substance induced-psychosis. Cocaine abuse. Acute Psychosis    CCA Screening, Triage and Referral (STR)  Patient Reported Information How did you hear about us ? Legal System  Referral name: No data recorded Referral phone number: No data recorded  Whom do you see for routine medical problems? No data recorded Practice/Facility Name: No data recorded Practice/Facility Phone Number: No data recorded Name of Contact: No data recorded Contact Number: No data recorded Contact Fax Number: No data recorded Prescriber Name: No data recorded Prescriber Address (if known): No data recorded  What Is the Reason for Your Visit/Call Today? Pt reports he has been having visual hallucinations for the past year, pt has not been seen for same in the past. Not on any medications. Pt denies SI. Pt has been out of prison and on probation. Pt cut his ankle monitor off tonight and his hallucinations are that the police are after him and going to kill him. Per daughter tonight pt was hallucinating that they were outside of his house and telling him they were going to shoot him so pt cut his monitor off  and was hiding in the closet. Pt denies alcohol use pt has hx cirrhosis pt smokes crack. Pt also reports he has bugs coming from orifices.  How Long Has This Been Causing You Problems? > than 6 months  What Do You Feel Would Help You the Most Today? No data recorded  Have You Recently Been in Any Inpatient Treatment (Hospital/Detox/Crisis Center/28-Day Program)? No data recorded Name/Location of Program/Hospital:No data recorded How Long Were You There? No data recorded When Were You Discharged? No  data recorded  Have You Ever Received Services From [facility] Before? No data recorded Who Do You See at [facility]? No data recorded  Have You Recently Had Any Thoughts About Hurting Yourself? No  Are You Planning to Commit Suicide/Harm Yourself At This time? No   Have you Recently Had Thoughts About Hurting Someone Cole Pierce? Yes  Explanation: No data recorded  Have You Used Any Alcohol or Drugs in the Past 24 Hours? No  How Long Ago Did You Use Drugs or Alcohol? No data recorded What Did You Use and How Much? No data recorded  Do You Currently Have a Therapist/Psychiatrist? No  Name of Therapist/Psychiatrist: No data recorded  Have You Been Recently Discharged From Any Office Practice or Programs? No  Explanation of Discharge From Practice/Program: No data recorded    CCA Screening Triage Referral Assessment Type of Contact: Face-to-Face  Is this Initial or Reassessment? No data recorded Date Telepsych consult ordered in [facility]:  No data recorded Time Telepsych consult ordered in [facility]:  No data recorded  Patient Reported Information Reviewed? No data recorded Patient Left Without Being Seen? No data recorded Reason for Not Completing Assessment: No data recorded  Collateral Involvement: No data recorded  Does Patient Have a Court Appointed Legal Guardian? No data recorded Name and Contact of Legal Guardian: No data recorded If Minor and Not Living with Parent(s), Who has Custody? No data recorded Is CPS involved or ever been involved? Never  Is APS involved or ever been involved? Never   Patient Determined To Be At Risk for Harm To Self or Others Based on Review of Patient Reported Information or Presenting Complaint? No  Method: No data recorded Availability of Means: No data recorded Intent: No data recorded Notification Required: No data recorded Additional Information for Danger to Others Potential: No data recorded Additional Comments for Danger to Others  Potential: No data recorded Are There Guns or Other Weapons in Your Home? No data recorded Types of Guns/Weapons: No data recorded Are These Weapons Safely Secured?                            No  Who Could Verify You Are Able To Have These Secured: No data recorded Do You Have any Outstanding Charges, Pending Court Dates, Parole/Probation? No data recorded Contacted To Inform of Risk of Harm To Self or Others: No data recorded  Location of Assessment: [facility] ED   Does Patient Present under Involuntary Commitment? Yes  IVC Papers Initial File Date: No data recorded  [place] of Residence: No data recorded  Patient Currently Receiving the Following Services: No data recorded  Determination of Need: Emergent (2 hours)   Options For Referral: No data recorded    CCA Biopsychosocial Intake/Chief Complaint:  No data recorded Current Symptoms/Problems: No data recorded  Patient Reported Schizophrenia/Schizoaffective Diagnosis in Past: No   Strengths: Patient is able to communicate his needs; has the support of  his daughter  Preferences: No data recorded Abilities: No data recorded  Type of Services Patient Feels are Needed: No data recorded  Initial Clinical Notes/Concerns: No data recorded  Mental Health Symptoms Depression:  None   Duration of Depressive symptoms: No data recorded  Mania:  Change in energy/activity; Euphoria; Increased Energy; Overconfidence; Racing thoughts; Recklessness   Anxiety:   Difficulty concentrating; Fatigue; Irritability; Restlessness; Worrying; Tension   Psychosis:  Delusions; Hallucinations   Duration of Psychotic symptoms: Greater than six months   Trauma:  None   Obsessions:  Poor insight; Recurrent & persistent thoughts/impulses/images   Compulsions:  Absent insight/delusional; Poor Insight; Repeated behaviors/mental acts   Inattention:  None   Hyperactivity/Impulsivity:  None   Oppositional/Defiant Behaviors:  None    Emotional Irregularity:  Mood lability; Potentially harmful impulsivity   Other Mood/Personality Symptoms:  No data recorded   Mental Status Exam Appearance and self-care  Stature:  Average   Weight:  Average weight   Clothing:  Disheveled   Grooming:  Normal   Cosmetic use:  None   Posture/gait:  Normal   Motor activity:  Not Remarkable   Sensorium  Attention:  Distractible; Inattentive   Concentration:  Scattered; Focuses on irrelevancies   Orientation:  Person; Place; Situation   Recall/memory:  Normal   Affect and Mood  Affect:  Anxious; Labile   Mood:  Anxious   Relating  Eye contact:  Normal   Facial expression:  Responsive   Attitude toward examiner:  Cooperative   Thought and Language  Speech flow: Flight of Ideas   Thought content:  Delusions; Persecutions; Suspicious   Preoccupation:  Ruminations; Homicidal   Hallucinations:  Auditory; Visual   Organization:  No data recorded  [employer] of Knowledge:  Fair   Intelligence:  Average   Abstraction:  Functional   Judgement:  Impaired; Poor   Reality Testing:  Distorted   Insight:  Denial; Lacking; None/zero insight   Decision Making:  Impulsive   Social Functioning  Social Maturity:  Impulsive   Social Judgement:  Heedless   Stress  Stressors:  Illness   Coping Ability:  Contractor Deficits:  None   Supports:  Family     Religion: Religion/Spirituality Are You A Religious Person?: No  Leisure/Recreation: Leisure / Recreation Do You Have Hobbies?: No  Exercise/Diet: Exercise/Diet Do You Exercise?: No Have You Gained or Lost A Significant Amount of Weight in the Past Six Months?: No Do You Follow a Special Diet?: No Do You Have Any Trouble Sleeping?: No   CCA Employment/Education Employment/Work Situation: Employment / Work Systems Developer: On disability Why is Patient on Disability: Unknown How Long has Patient Been on  Disability: Unknown Has Patient ever Been in the [employer]?: No  Education: Education Is Patient Currently Attending School?: No Did You Have An Individualized Education Program (IIEP): No Did You Have Any Difficulty At [employer]?: No Patient's Education Has Been Impacted by Current Illness: No   CCA Family/Childhood History Family and Relationship History: Family history Marital status: Single Does patient have children?: Yes How many children?: 2 How is patient's relationship with their children?: Patient has a good relationship with his children  Childhood History:  Childhood History Did patient suffer any verbal/emotional/physical/sexual abuse as a child?: No Did patient suffer from severe childhood neglect?: No Has patient ever been sexually abused/assaulted/raped as an adolescent or adult?: No Was the patient ever a victim of a crime or a disaster?: No Witnessed domestic  violence?: No Has patient been affected by domestic violence as an adult?: No  Child/Adolescent Assessment:     CCA Substance Use Alcohol/Drug Use: Alcohol / Drug Use Pain Medications: see mar Prescriptions: see mar Over the Counter: see mar History of alcohol / drug use?: Yes Substance #1 Name of Substance 1: Cocaine 1 - Age of First Use: unknown 1 - Amount (size/oz): unknown amounts 1 - Frequency: once a month 1 - Last Use / Amount: 12/12/23 1- Route of Use: smoking                       ASAM's:  Six Dimensions of Multidimensional Assessment  Dimension 1:  Acute Intoxication and/or Withdrawal Potential:      Dimension 2:  Biomedical Conditions and Complications:      Dimension 3:  Emotional, Behavioral, or Cognitive Conditions and Complications:     Dimension 4:  Readiness to Change:     Dimension 5:  Relapse, Continued use, or Continued Problem Potential:     Dimension 6:  Recovery/Living Environment:     ASAM Severity Score:    ASAM Recommended Level of Treatment: ASAM  Recommended Level of Treatment: Level III Residential Treatment   Substance use Disorder (SUD) Substance Use Disorder (SUD)  Checklist Symptoms of Substance Use: Continued use despite having a persistent/recurrent physical/psychological problem caused/exacerbated by use, Continued use despite persistent or recurrent social, interpersonal problems, caused or exacerbated by use, Evidence of withdrawal (Comment), Large amounts of time spent to obtain, use or recover from the substance(s), Persistent desire or unsuccessful efforts to cut down or control use, Presence of craving or strong urge to use, Recurrent use that results in a failure to fulfill major role obligations (work, school, home), Social, occupational, recreational activities given up or reduced due to use, Substance(s) often taken in larger amounts or over longer times than was intended  Recommendations for Services/Supports/Treatments:    DSM5 Diagnoses: Patient Active Problem List   Diagnosis Date Noted   Protein-calorie malnutrition, severe 07/10/2022   Decompensated hepatic cirrhosis (HCC) 07/08/2022   Pancytopenia (HCC) 07/08/2022   Renal cell carcinoma (HCC) 07/08/2022   Right low back pain 07/08/2022   Right inguinal hernia 08/22/2021   Calculus of gallbladder without cholecystitis without obstruction 08/22/2021   Type 2 diabetes mellitus without complication, without long-term current use of insulin  (HCC) 01/19/2021   Cirrhosis of liver with ascites (HCC) 01/19/2021   Abdominal fluid collection 01/19/2021   Iron deficiency anemia 07/07/2020   Thrombocytopenia 07/07/2020   Umbilical hernia 07/07/2020   Seropositive rheumatoid arthritis (HCC) 03/25/2020    Patient Centered Plan: Patient is on the following Treatment Plan(s):  Impulse Control and Substance Abuse   Referrals to Alternative Service(s): Referred to Alternative Service(s):   Place:   Date:   Time:    Referred to Alternative Service(s):   Place:   Date:    Time:    Referred to Alternative Service(s):   Place:   Date:   Time:    Referred to Alternative Service(s):   Place:   Date:   Time:      @BHCOLLABOFCARE @  [employer], LCAS-A

## 2023-12-13 NOTE — ED Provider Notes (Addendum)
 Emergency Medicine Observation Re-evaluation Note  Yossi Hinchman is a 58 y.o. male, seen on rounds today.  Pt initially presented to the ED for complaints of Hallucinations Currently, the patient is resting.  Physical Exam  BP (!) 169/93   Pulse 100   Temp 98.4 F (36.9 C)   Resp 18   Ht 5' 8 (1.727 m)   Wt 73.5 kg   SpO2 100%   BMI 24.63 kg/m  Physical Exam .Gen:  No acute distress Resp:  Breathing easily and comfortably, no accessory muscle usage Neuro:  Moving all four extremities, no gross focal neuro deficits Psych:  Resting currently, calm when awake   ED Course / MDM  EKG:   I have reviewed the labs performed to date as well as medications administered while in observation.  Recent changes in the last 24 hours include no acute events.  Plan  Current plan is for pending psych dispo.  Patient has also history of cirrhosis, is supposed be on lactulose , will reorder his home lactulose  and get an ammonia level.    Waymond Lorelle Cummins, MD 12/13/23 [id number]    Waymond Lorelle Cummins, MD 12/13/23 [phone]

## 2023-12-13 NOTE — ED Notes (Signed)
 Pt made aware of need for urine sample.

## 2023-12-13 NOTE — Progress Notes (Signed)
 Patient well known to nurse navigator from prior admissions. Patient has a hx of liver cirrhosis and is followed by [facility].  Met with patient at bedside. Patient alert and oriented; however expresses seeing things and feeling like the cops are trying to kill me.  Patient endorses both feeling and seeing bugs under his skin and coming out of his nose. Patient states he caught some in a jar and has them at home. He also states he might have the same thing the dogs have. Going on to describe the dogs having little black bugs and sometimes worms. Patient states he has had multiple occurrences of finding his lug nuts loosened on his vehicles and damage to [his] trailer and property. Patient endorses seeing and hearing people on his property, that he describes as task force members or at the very least people dressed in camo and masks. Patient further states I know some stuff isn't real, but some of it is. Other people have seen it too.  This presentation is extremely different from presentations/interactions in the past. Patient does endorse having used cocaine prior to coming in, but states it is not regular due to drug tests from probation/parole and his medical problems. Patient has a long history of incarceration, but has reintegrated relatively well over the last few years since release. Patient lives alone with his small dogs. Does have family support from his sister & daughter (who both live local) and his brother who lives in [place] . Patient states other than these things his life has been improved. He denies any SDOH needs, stating all previous needs have been resolved.  Attending MD made aware of liver cirrhosis hx and previous rx for lactulose .

## 2023-12-14 ENCOUNTER — Telehealth: Payer: Self-pay

## 2023-12-16 DIAGNOSIS — F29 Unspecified psychosis not due to a substance or known physiological condition: Secondary | ICD-10-CM | POA: Diagnosis not present

## 2023-12-16 DIAGNOSIS — R161 Splenomegaly, not elsewhere classified: Secondary | ICD-10-CM | POA: Diagnosis not present

## 2023-12-16 DIAGNOSIS — B1920 Unspecified viral hepatitis C without hepatic coma: Secondary | ICD-10-CM | POA: Diagnosis not present

## 2023-12-16 DIAGNOSIS — E119 Type 2 diabetes mellitus without complications: Secondary | ICD-10-CM | POA: Diagnosis not present

## 2023-12-16 DIAGNOSIS — C649 Malignant neoplasm of unspecified kidney, except renal pelvis: Secondary | ICD-10-CM | POA: Diagnosis not present

## 2023-12-16 DIAGNOSIS — K7460 Unspecified cirrhosis of liver: Secondary | ICD-10-CM | POA: Diagnosis not present

## 2023-12-16 DIAGNOSIS — N289 Disorder of kidney and ureter, unspecified: Secondary | ICD-10-CM | POA: Diagnosis not present

## 2023-12-16 DIAGNOSIS — I10 Essential (primary) hypertension: Secondary | ICD-10-CM | POA: Diagnosis not present

## 2023-12-16 DIAGNOSIS — K469 Unspecified abdominal hernia without obstruction or gangrene: Secondary | ICD-10-CM | POA: Diagnosis not present

## 2023-12-17 DIAGNOSIS — K7460 Unspecified cirrhosis of liver: Secondary | ICD-10-CM | POA: Diagnosis not present

## 2023-12-17 DIAGNOSIS — C649 Malignant neoplasm of unspecified kidney, except renal pelvis: Secondary | ICD-10-CM | POA: Diagnosis not present

## 2023-12-17 DIAGNOSIS — R161 Splenomegaly, not elsewhere classified: Secondary | ICD-10-CM | POA: Diagnosis not present

## 2023-12-17 DIAGNOSIS — E119 Type 2 diabetes mellitus without complications: Secondary | ICD-10-CM | POA: Diagnosis not present

## 2023-12-17 DIAGNOSIS — B1920 Unspecified viral hepatitis C without hepatic coma: Secondary | ICD-10-CM | POA: Diagnosis not present

## 2023-12-17 DIAGNOSIS — I10 Essential (primary) hypertension: Secondary | ICD-10-CM | POA: Diagnosis not present

## 2023-12-17 DIAGNOSIS — N289 Disorder of kidney and ureter, unspecified: Secondary | ICD-10-CM | POA: Diagnosis not present

## 2023-12-17 DIAGNOSIS — K469 Unspecified abdominal hernia without obstruction or gangrene: Secondary | ICD-10-CM | POA: Diagnosis not present

## 2023-12-17 DIAGNOSIS — F29 Unspecified psychosis not due to a substance or known physiological condition: Secondary | ICD-10-CM | POA: Diagnosis not present

## 2023-12-18 ENCOUNTER — Telehealth: Payer: Self-pay

## 2023-12-18 DIAGNOSIS — K469 Unspecified abdominal hernia without obstruction or gangrene: Secondary | ICD-10-CM | POA: Diagnosis not present

## 2023-12-18 DIAGNOSIS — K7460 Unspecified cirrhosis of liver: Secondary | ICD-10-CM | POA: Diagnosis not present

## 2023-12-18 DIAGNOSIS — R161 Splenomegaly, not elsewhere classified: Secondary | ICD-10-CM | POA: Diagnosis not present

## 2023-12-18 DIAGNOSIS — E119 Type 2 diabetes mellitus without complications: Secondary | ICD-10-CM | POA: Diagnosis not present

## 2023-12-18 DIAGNOSIS — C649 Malignant neoplasm of unspecified kidney, except renal pelvis: Secondary | ICD-10-CM | POA: Diagnosis not present

## 2023-12-18 DIAGNOSIS — N289 Disorder of kidney and ureter, unspecified: Secondary | ICD-10-CM | POA: Diagnosis not present

## 2023-12-18 DIAGNOSIS — I10 Essential (primary) hypertension: Secondary | ICD-10-CM | POA: Diagnosis not present

## 2023-12-18 DIAGNOSIS — B1920 Unspecified viral hepatitis C without hepatic coma: Secondary | ICD-10-CM | POA: Diagnosis not present

## 2023-12-18 DIAGNOSIS — F29 Unspecified psychosis not due to a substance or known physiological condition: Secondary | ICD-10-CM | POA: Diagnosis not present

## 2023-12-19 DIAGNOSIS — C649 Malignant neoplasm of unspecified kidney, except renal pelvis: Secondary | ICD-10-CM | POA: Diagnosis not present

## 2023-12-19 DIAGNOSIS — I10 Essential (primary) hypertension: Secondary | ICD-10-CM | POA: Diagnosis not present

## 2023-12-19 DIAGNOSIS — K7460 Unspecified cirrhosis of liver: Secondary | ICD-10-CM | POA: Diagnosis not present

## 2023-12-19 DIAGNOSIS — E119 Type 2 diabetes mellitus without complications: Secondary | ICD-10-CM | POA: Diagnosis not present

## 2023-12-19 DIAGNOSIS — N289 Disorder of kidney and ureter, unspecified: Secondary | ICD-10-CM | POA: Diagnosis not present

## 2023-12-19 DIAGNOSIS — B1920 Unspecified viral hepatitis C without hepatic coma: Secondary | ICD-10-CM | POA: Diagnosis not present

## 2023-12-19 DIAGNOSIS — K469 Unspecified abdominal hernia without obstruction or gangrene: Secondary | ICD-10-CM | POA: Diagnosis not present

## 2023-12-19 DIAGNOSIS — F29 Unspecified psychosis not due to a substance or known physiological condition: Secondary | ICD-10-CM | POA: Diagnosis not present

## 2023-12-19 DIAGNOSIS — R161 Splenomegaly, not elsewhere classified: Secondary | ICD-10-CM | POA: Diagnosis not present

## 2023-12-20 DIAGNOSIS — N289 Disorder of kidney and ureter, unspecified: Secondary | ICD-10-CM | POA: Diagnosis not present

## 2023-12-20 DIAGNOSIS — B1920 Unspecified viral hepatitis C without hepatic coma: Secondary | ICD-10-CM | POA: Diagnosis not present

## 2023-12-20 DIAGNOSIS — R161 Splenomegaly, not elsewhere classified: Secondary | ICD-10-CM | POA: Diagnosis not present

## 2023-12-20 DIAGNOSIS — C649 Malignant neoplasm of unspecified kidney, except renal pelvis: Secondary | ICD-10-CM | POA: Diagnosis not present

## 2023-12-20 DIAGNOSIS — E119 Type 2 diabetes mellitus without complications: Secondary | ICD-10-CM | POA: Diagnosis not present

## 2023-12-20 DIAGNOSIS — K469 Unspecified abdominal hernia without obstruction or gangrene: Secondary | ICD-10-CM | POA: Diagnosis not present

## 2023-12-20 DIAGNOSIS — F29 Unspecified psychosis not due to a substance or known physiological condition: Secondary | ICD-10-CM | POA: Diagnosis not present

## 2023-12-20 DIAGNOSIS — K7460 Unspecified cirrhosis of liver: Secondary | ICD-10-CM | POA: Diagnosis not present

## 2023-12-20 DIAGNOSIS — I10 Essential (primary) hypertension: Secondary | ICD-10-CM | POA: Diagnosis not present

## 2023-12-23 ENCOUNTER — Telehealth: Payer: Self-pay

## 2023-12-23 NOTE — Progress Notes (Signed)
 Complex Care Management Note Care Guide Note  12/23/2023 Name: Cole Pierce MRN: [id number] DOB: 1965/01/24   Complex Care Management Outreach Attempts: An unsuccessful telephone outreach was attempted today to offer the patient information about available complex care management services.  Follow Up Plan:  Additional outreach attempts will be made to offer the patient complex care management information and services.   Encounter Outcome:  No Answer  Jeoffrey Buffalo , RMA     [place]  [facility], [facility] Guide  Direct Dial: [phone]  Website: [email or web address]

## 2024-01-06 ENCOUNTER — Telehealth: Payer: Self-pay

## 2024-01-08 NOTE — Progress Notes (Signed)
 Complex Care Management Note Care Guide Note  01/08/2024 Name: Cole Pierce MRN: [id number] DOB: 09-Dec-1965   Complex Care Management Outreach Attempts: A second unsuccessful outreach was attempted today to offer the patient with information about available complex care management services.  Follow Up Plan:  Additional outreach attempts will be made to offer the patient complex care management information and services.   Encounter Outcome:  No Answer  Jeoffrey Buffalo , RMA     [place]  [facility], [facility] Guide  Direct Dial: [phone]  Website: [email or web address]

## 2024-01-09 NOTE — Progress Notes (Signed)
 Complex Care Management Note Care Guide Note  01/09/2024 Name: Cole Pierce MRN: [id number] DOB: 06-07-1965   Complex Care Management Outreach Attempts: A third unsuccessful outreach was attempted today to offer the patient with information about available complex care management services.  Follow Up Plan:  Additional outreach attempts will be made to offer the patient complex care management information and services.   Encounter Outcome:  No Answer  Jeoffrey Buffalo , RMA     [place]  [facility], [facility] Guide  Direct Dial: [phone]  Website: [email or web address]
